# Patient Record
Sex: Female | Born: 1980 | Race: White | Hispanic: No | Marital: Single | Smoking: Never smoker
Health system: Southern US, Community
[De-identification: ages and names within clinical notes are randomized; demographics above are authoritative.]

## PROBLEM LIST (undated history)

## (undated) DIAGNOSIS — E079 Disorder of thyroid, unspecified: Secondary | ICD-10-CM

## (undated) DIAGNOSIS — F419 Anxiety disorder, unspecified: Secondary | ICD-10-CM

## (undated) HISTORY — PX: CHOLECYSTECTOMY: SHX55

## (undated) HISTORY — PX: TONSILLECTOMY: SUR1361

---

## 2005-12-03 ENCOUNTER — Emergency Department: Payer: Self-pay | Admitting: Emergency Medicine

## 2006-01-25 ENCOUNTER — Emergency Department: Payer: Self-pay | Admitting: Emergency Medicine

## 2006-01-26 ENCOUNTER — Inpatient Hospital Stay: Payer: Self-pay | Admitting: General Surgery

## 2006-01-26 ENCOUNTER — Ambulatory Visit: Payer: Self-pay | Admitting: Emergency Medicine

## 2011-11-04 ENCOUNTER — Emergency Department: Payer: Self-pay | Admitting: Emergency Medicine

## 2012-01-07 ENCOUNTER — Emergency Department: Payer: Self-pay | Admitting: Emergency Medicine

## 2013-07-18 ENCOUNTER — Emergency Department: Payer: Self-pay | Admitting: Emergency Medicine

## 2015-05-20 ENCOUNTER — Encounter: Payer: Self-pay | Admitting: Emergency Medicine

## 2015-05-20 ENCOUNTER — Emergency Department
Admission: EM | Admit: 2015-05-20 | Discharge: 2015-05-20 | Disposition: A | Discharge status: Home / Self Care | Payer: Medicare Other | Attending: Emergency Medicine | Admitting: Emergency Medicine

## 2015-05-20 DIAGNOSIS — K047 Periapical abscess without sinus: Secondary | ICD-10-CM

## 2015-05-20 DIAGNOSIS — K029 Dental caries, unspecified: Secondary | ICD-10-CM | POA: Insufficient documentation

## 2015-05-20 DIAGNOSIS — F419 Anxiety disorder, unspecified: Secondary | ICD-10-CM | POA: Insufficient documentation

## 2015-05-20 DIAGNOSIS — K0889 Other specified disorders of teeth and supporting structures: Secondary | ICD-10-CM | POA: Diagnosis present

## 2015-05-20 HISTORY — DX: Disorder of thyroid, unspecified: E07.9

## 2015-05-20 HISTORY — DX: Anxiety disorder, unspecified: F41.9

## 2015-05-20 MED ORDER — IBUPROFEN 800 MG PO TABS: 800.0000 mg | ORAL_TABLET | Freq: Three times a day (TID) | ORAL | Status: DC | PRN

## 2015-05-20 MED ORDER — HYDROCODONE-ACETAMINOPHEN 5-325 MG PO TABS: 1.0000 | ORAL_TABLET | ORAL | Status: DC | PRN

## 2015-05-20 MED ORDER — AMOXICILLIN 500 MG PO TABS: 500.0000 mg | ORAL_TABLET | Freq: Three times a day (TID) | ORAL | Status: DC

## 2015-05-20 NOTE — ED Provider Notes (Signed)
[facility] Emergency Department Provider Note  ____________________________________________  Time seen: Approximately 3:11 PM  I have reviewed the triage vital signs and the nursing notes.   HISTORY  Chief Complaint Dental Pain and Anxiety   HPI Savannah Leach is a 34 y.o. female 's for evaluation of dental pain. Patient states that the pains are normal for about 2 days progressively getting worse.   Past Medical History  Diagnosis Date  . Thyroid disease   . Anxiety     There are no active problems to display for this patient.   Past Surgical History  Procedure Laterality Date  . Tonsillectomy    . Cholecystectomy      Current Outpatient Rx  Name  Route  Sig  Dispense  Refill  . amoxicillin (AMOXIL) 500 MG tablet   Oral   Take 1 tablet (500 mg total) by mouth 3 (three) times daily.   30 tablet   0   . HYDROcodone-acetaminophen (NORCO) 5-325 MG tablet   Oral   Take 1-2 tablets by mouth every 4 (four) hours as needed for moderate pain.   8 tablet   0   . ibuprofen (ADVIL,MOTRIN) 800 MG tablet   Oral   Take 1 tablet (800 mg total) by mouth every 8 (eight) hours as needed.   30 tablet   0     Allergies Review of patient's allergies indicates no known allergies.  No family history on file.  Social History Social History  Substance Use Topics  . Smoking status: Never Smoker   . Smokeless tobacco: None  . Alcohol Use: No    Review of Systems Constitutional: No fever/chills Eyes: No visual changes. ENT: No sore throat. Positive dental pain Cardiovascular: Denies chest pain. Respiratory: Denies shortness of breath. Gastrointestinal: No abdominal pain.  No nausea, no vomiting.  No diarrhea.  No constipation. Genitourinary: Negative for dysuria. Musculoskeletal: Negative for back pain. Skin: Negative for rash. Neurological: Negative for headaches, focal weakness or numbness.  10-point ROS otherwise  negative.  ____________________________________________   PHYSICAL EXAM:  VITAL SIGNS: ED Triage Vitals  Enc Vitals Group     BP 05/20/15 1456 172/90 mmHg     Pulse Rate 05/20/15 1456 94     Resp 05/20/15 1456 18     Temp 05/20/15 1456 98.5 F (36.9 C)     Temp Source 05/20/15 1456 Oral     SpO2 05/20/15 1456 100 %     Weight 05/20/15 1456 180 lb (81.647 kg)     Height 05/20/15 1456  (1.651 m)     Head Cir --      Peak Flow --      Pain Score 05/20/15 1458 7     Pain Loc --      Pain Edu? --      Excl. in GC? --     Constitutional: Alert and oriented. Well appearing and in no acute distress. Does not appear to be anxious at this time. Eyes: Conjunctivae are normal. PERRL. EOMI. Head: Atraumatic. Nose: No congestion/rhinnorhea. Mouth/Throat: Mucous membranes are moist.  Oropharynx non-erythematous. Obvious dental caries left lower wisdom tooth. Neck: No stridor. No cervical tenderness no cervical adenopathy noted.  Cardiovascular: Normal rate, regular rhythm. Grossly normal heart sounds.  Good peripheral circulation. Respiratory: Normal respiratory effort.  No retractions. Lungs CTAB. Musculoskeletal: No lower extremity tenderness nor edema.  No joint effusions. Neurologic:  Normal speech and language. No gross focal neurologic deficits are appreciated. No gait  instability. Skin:  Skin is warm, dry and intact. No rash noted. Psychiatric: Mood and affect are normal. Speech and behavior are normal.  ____________________________________________   LABS (all labs ordered are listed, but only abnormal results are displayed)  Labs Reviewed - No data to display ____________________________________________    PROCEDURES  Procedure(s) performed: None  Critical Care performed: No  ____________________________________________   INITIAL IMPRESSION / ASSESSMENT AND PLAN / ED COURSE  Pertinent labs & imaging results that were available during my care of the patient  were reviewed by me and considered in my medical decision making (see chart for details).  Acute dental caries with questionable early developing abscess. Rx amoxicillin 500 mg 3 times a day Motrin 800 mg 3 times a day and hydrocodone 5/325 as needed for severe pain. Patient continue on current medications and follow up with PCP as needed. She voices no other emergency medical complaints at this time. ____________________________________________   FINAL CLINICAL IMPRESSION(S) / ED DIAGNOSES  Final diagnoses:  Infected dental carries      Evangeline Dakinharles M Beers, PA-C 05/20/15 1515  Phineas SemenGraydon Goodman, MD 05/21/15 [phone]

## 2015-05-20 NOTE — Discharge Instructions (Signed)
OPTIONS FOR DENTAL FOLLOW UP CARE ° °[place] [facility] of Health and Human Services - Local Safety Net Dental Clinics °http://www.ncdhhs.gov/dph/oralhealth/services/safetynetclinics.htm °  °[facility] (336-562-3123) ° °[facility] [place] ([phone]) ° °[facility] ([phone] ext [phone]) ° °[facility] ([phone]) ° °[facility] ([phone]) °This clinic caters to the indigent population and is on a lottery system. °Location: °[facility], [address], [place] °Clinic Hours: °Wednesdays from 6pm - 9pm, patients seen by a lottery system. °For dates, call or go to [email or web address].[facility].edu/[facility]/patients/Dental-[facility] °Services: °Cleanings, fillings and simple extractions. °Payment Options: °DENTAL WORK IS FREE OF CHARGE. Bring proof of income or support. °Best way to get seen: °Arrive at 5:15 pm - this is a lottery, NOT first come/first serve, so arriving earlier will not increase your chances of being seen. °  °  °[facility] °[phone] °Select option 1 for emergencies °  °Location: °[facility], [address], [place] °Clinic Hours: °No walk-ins accepted - call the day before to schedule an appointment. °Check in times are 9:30 am and 1:30 pm. °Services: °Simple extractions, temporary fillings, pulpectomy/pulp debridement, uncomplicated abscess drainage. °Payment Options: °PAYMENT IS DUE AT THE TIME OF SERVICE.  Fee is usually $100-200, additional surgical procedures (e.g. abscess drainage) may be extra. °Cash, checks, Visa/MasterCard accepted.  Can file Medicaid if patient is covered for dental - patient should call case worker to check. °No discount for [facility] patients. °Best way to get seen: °MUST call the day before and get onto the schedule. Can usually be seen the next 1-2 days. No walk-ins accepted. °  °  °[facility] °[phone] °   °Location: °[facility], [address], [place] °Clinic Hours: °M, W, Th, F 8am or 1:30pm, Tues 9a or 1:30 - first come/first served. °Services: °Simple extractions, temporary fillings, uncomplicated abscess drainage.  You do not need to be an [place] resident. °Payment Options: °PAYMENT IS DUE AT THE TIME OF SERVICE. °Dental insurance, otherwise sliding scale - bring proof of income or support. °Depending on income and treatment needed, cost is usually $50-200. °Best way to get seen: °Arrive early as it is first come/first served. °  °  °[facility] °[phone] °  °Location: °[address] °Clinic Hours: °Mon-Thu 8a-5p °Services: °Most basic dental services including extractions and fillings. °Payment Options: °PAYMENT IS DUE AT THE TIME OF SERVICE. °Sliding scale, up to 50% off - bring proof if income or support. °Medicaid with dental option accepted. °Best way to get seen: °Call to schedule an appointment, can usually be seen within 2 weeks OR they will try to see walk-ins - show up at 8a or 2p (you may have to wait). °  °  °[facility] °[phone] °[place] RESIDENTS ONLY °  °Location: °[facility], [address], [place],  [address] °Clinic Hours: By appointment only. °Monday - Thursday 8am-5pm, Friday 8am-12pm °Services: Cleanings, fillings, extractions. °Payment Options: °PAYMENT IS DUE AT THE TIME OF SERVICE. °Cash, Visa or MasterCard. Sliding scale - $30 minimum per service. °Best way to get seen: °Come in to office, complete packet and make an appointment - need proof of income °or support monies for each household member and proof of [place] residence. °Usually takes about a month to get in. °  °  °[facility] °[phone] °  °Location: °[address]   [place] °Clinic Hours: Walk-in Urgent Care Dental Services are offered Monday-Friday  mornings only. °The numbers of emergencies accepted daily is limited to the number of °providers available. °Maximum 15 - Mondays, Wednesdays & Thursdays °Maximum 10 - Tuesdays & Fridays °Services: °You do not need to be a [place] resident to be seen for a dental emergency. °Emergencies are defined as pain, swelling, abnormal bleeding, or dental trauma. Walkins will receive x-rays if needed. °NOTE: Dental cleaning is not an emergency. °Payment Options: °PAYMENT IS DUE AT THE TIME OF SERVICE. °Minimum co-pay is $40.00 for uninsured patients. °Minimum co-pay is $3.00 for Medicaid with dental coverage. °Dental Insurance is accepted and must be presented at time of visit. °Medicare does not cover dental. °Forms of payment: Cash, credit card, checks. °Best way to get seen: °If not previously registered with the clinic, walk-in dental registration begins at 7:15 am and is on a first come/first serve basis. °If previously registered with the clinic, call to make an appointment. °  °  °[facility] °[phone] °[place] RESIDENTS ONLY °  °Location: °[address], [place], [place] °Clinic Hours: °Mon-Thu 10a-2p °Services: Extractions only! °Payment Options: °FREE (donations accepted) - bring proof of income or support °Best way to get seen: °Call and schedule an appointment OR come at 8am on the 1st Monday of every month (except for holidays) when it is first come/first served. °  °  °[facility] °[phone] °  °Location: °[address], [place] °Clinic Hours: °Friday mornings °Services, Payment Options, Best way to get seen: °Call for info ° ° ° °Dental Caries °Dental caries is tooth decay. This decay can cause a hole in teeth (cavity) that can get bigger and deeper over time. °HOME CARE °· Brush and floss your teeth. Do this at least two times a day. °· Use a fluoride toothpaste. °· Use a mouth rinse if told by your dentist or doctor. °· Eat less sugary and starchy foods. Drink less sugary  drinks. °· Avoid snacking often on sugary and starchy foods. Avoid sipping often on sugary drinks. °· Keep regular checkups and cleanings with your dentist. °· Use fluoride supplements if told by your dentist or doctor. °· Allow fluoride to be applied to teeth if told by your dentist or doctor. °  °This information is not intended to replace advice given to you by your health care provider. Make sure you discuss any questions you have with your health care provider. °  °Document Released: 04/15/2008 Document Revised: 07/28/2014 Document Reviewed: 07/09/2012 °Elsevier Interactive Patient Education ©2016 [employer] Inc. ° °

## 2015-05-20 NOTE — ED Notes (Signed)
Pt presents to ED with complaints of oral pain on left side of mouth. Pt states she is also having a panic attack; pt with history of anxiety and has not taken morning meds. PA at bedside.

## 2015-06-04 ENCOUNTER — Emergency Department: Payer: Medicare Other

## 2015-06-04 ENCOUNTER — Encounter: Payer: Self-pay | Admitting: Emergency Medicine

## 2015-06-04 ENCOUNTER — Emergency Department
Admission: EM | Admit: 2015-06-04 | Discharge: 2015-06-04 | Disposition: A | Discharge status: Home / Self Care | Payer: Medicare Other | Attending: Emergency Medicine | Admitting: Emergency Medicine

## 2015-06-04 DIAGNOSIS — W010XXA Fall on same level from slipping, tripping and stumbling without subsequent striking against object, initial encounter: Secondary | ICD-10-CM | POA: Insufficient documentation

## 2015-06-04 DIAGNOSIS — S199XXA Unspecified injury of neck, initial encounter: Secondary | ICD-10-CM | POA: Diagnosis not present

## 2015-06-04 DIAGNOSIS — S59911A Unspecified injury of right forearm, initial encounter: Secondary | ICD-10-CM | POA: Diagnosis not present

## 2015-06-04 DIAGNOSIS — Y9289 Other specified places as the place of occurrence of the external cause: Secondary | ICD-10-CM | POA: Insufficient documentation

## 2015-06-04 DIAGNOSIS — Y998 Other external cause status: Secondary | ICD-10-CM | POA: Insufficient documentation

## 2015-06-04 DIAGNOSIS — Z79899 Other long term (current) drug therapy: Secondary | ICD-10-CM | POA: Diagnosis not present

## 2015-06-04 DIAGNOSIS — S4991XA Unspecified injury of right shoulder and upper arm, initial encounter: Secondary | ICD-10-CM | POA: Diagnosis present

## 2015-06-04 DIAGNOSIS — M7918 Myalgia, other site: Secondary | ICD-10-CM

## 2015-06-04 DIAGNOSIS — Y9389 Activity, other specified: Secondary | ICD-10-CM | POA: Insufficient documentation

## 2015-06-04 DIAGNOSIS — S6991XA Unspecified injury of right wrist, hand and finger(s), initial encounter: Secondary | ICD-10-CM | POA: Insufficient documentation

## 2015-06-04 DIAGNOSIS — Z792 Long term (current) use of antibiotics: Secondary | ICD-10-CM | POA: Insufficient documentation

## 2015-06-04 MED ORDER — CYCLOBENZAPRINE HCL 10 MG PO TABS: 10.0000 mg | ORAL_TABLET | Freq: Three times a day (TID) | ORAL | Status: DC | PRN

## 2015-06-04 MED ORDER — TRAMADOL HCL 50 MG PO TABS
50.0000 mg | ORAL_TABLET | Freq: Once | ORAL | Status: AC
  Administered 2015-06-04: 50 mg via ORAL

## 2015-06-04 MED ORDER — CYCLOBENZAPRINE HCL 10 MG PO TABS
ORAL_TABLET | ORAL | Status: AC
  Administered 2015-06-04: 10 mg via ORAL
  Filled 2015-06-04: qty 1

## 2015-06-04 MED ORDER — TRAMADOL HCL 50 MG PO TABS
ORAL_TABLET | ORAL | Status: AC
  Administered 2015-06-04: 50 mg via ORAL
  Filled 2015-06-04: qty 1

## 2015-06-04 MED ORDER — CYCLOBENZAPRINE HCL 10 MG PO TABS
10.0000 mg | ORAL_TABLET | Freq: Once | ORAL | Status: AC
  Administered 2015-06-04: 10 mg via ORAL

## 2015-06-04 MED ORDER — TRAMADOL HCL 50 MG PO TABS: 50.0000 mg | ORAL_TABLET | Freq: Four times a day (QID) | ORAL | Status: DC | PRN

## 2015-06-04 NOTE — ED Notes (Signed)
States pain from R side of neck down to hand

## 2015-06-04 NOTE — ED Provider Notes (Signed)
[facility] Emergency Department Provider Note ____________________________________________  Time seen: Approximately 3:50 PM  I have reviewed the triage vital signs and the nursing notes.   HISTORY  Chief Complaint Arm Injury   HPI Savannah Leach is a 34 y.o. female who presents to the emergency department for evaluation of a mechanical fall this morning while going to the mailbox. She states she tripped on a small stump and her body twisted as she fell and she landed directly on her right shoulder. She now has pain in the entire right arm that radiates into her neck.    Past Medical History  Diagnosis Date  . Thyroid disease   . Anxiety     There are no active problems to display for this patient.   Past Surgical History  Procedure Laterality Date  . Tonsillectomy    . Cholecystectomy      Current Outpatient Rx  Name  Route  Sig  Dispense  Refill  . levothyroxine (SYNTHROID, LEVOTHROID) 75 MCG tablet   Oral   Take 75 mcg by mouth daily before breakfast.         . PARoxetine (PAXIL) 20 MG tablet   Oral   Take 20 mg by mouth daily.         Marland Kitchen amoxicillin (AMOXIL) 500 MG tablet   Oral   Take 1 tablet (500 mg total) by mouth 3 (three) times daily.   30 tablet   0   . cyclobenzaprine (FLEXERIL) 10 MG tablet   Oral   Take 1 tablet (10 mg total) by mouth every 8 (eight) hours as needed for muscle spasms.   30 tablet   1   . HYDROcodone-acetaminophen (NORCO) 5-325 MG tablet   Oral   Take 1-2 tablets by mouth every 4 (four) hours as needed for moderate pain.   8 tablet   0   . ibuprofen (ADVIL,MOTRIN) 800 MG tablet   Oral   Take 1 tablet (800 mg total) by mouth every 8 (eight) hours as needed.   30 tablet   0   . traMADol (ULTRAM) 50 MG tablet   Oral   Take 1 tablet (50 mg total) by mouth every 6 (six) hours as needed.   9 tablet   0     Allergies Review of patient's allergies indicates no known allergies.  No family  history on file.  Social History Social History  Substance Use Topics  . Smoking status: Never Smoker   . Smokeless tobacco: None  . Alcohol Use: No    Review of Systems Constitutional: No recent illness. Eyes: No visual changes. ENT: No sore throat. Cardiovascular: Denies chest pain or palpitations. Respiratory: Denies shortness of breath. Gastrointestinal: No abdominal pain.  Genitourinary: Negative for dysuria. Musculoskeletal: Pain in right lateral neck; right shoulder, right forearm, and right wrist. Skin: Negative for rash. Neurological: Negative for headaches, focal weakness or numbness. 10-point ROS otherwise negative.  ____________________________________________   PHYSICAL EXAM:  VITAL SIGNS: ED Triage Vitals  Enc Vitals Group     BP 06/04/15 1527 164/81 mmHg     Pulse Rate 06/04/15 1527 92     Resp 06/04/15 1527 18     Temp 06/04/15 1527 98.4 F (36.9 C)     Temp Source 06/04/15 1527 Oral     SpO2 06/04/15 1527 99 %     Weight 06/04/15 1527 180 lb (81.647 kg)     Height 06/04/15 1527  (1.651 m)  Head Cir --      Peak Flow --      Pain Score 06/04/15 1529 7     Pain Loc --      Pain Edu? --      Excl. in GC? --    Constitutional: Alert and oriented. Well appearing and in no acute distress. Eyes: Conjunctivae are normal. EOMI. Head: Atraumatic. Nose: No congestion/rhinnorhea. Neck: No stridor.  Respiratory: Normal respiratory effort.   Musculoskeletal: Limited ROM of right wrist, pain worse with supination. Full ROM of shoulder with complaint of pain. Right lateral neck tender to palpation--Nexus Criteria negative. Neurologic:  Normal speech and language. No gross focal neurologic deficits are appreciated. Speech is normal. No gait instability. Skin:  Skin is warm, dry and intact. Atraumatic. Psychiatric: Mood and affect are normal. Speech and behavior are normal.  ____________________________________________   LABS (all labs ordered are  listed, but only abnormal results are displayed)  Labs Reviewed - No data to display ____________________________________________  RADIOLOGY  Right shoulder and forearm are negative for acute abnormality.  I, Kem Boroughsari Woodley Petzold, personally viewed and evaluated these images (plain radiographs) as part of my medical decision making.  ____________________________________________   PROCEDURES  Procedure(s) performed:   Sling applied to right arm for comfort by RN.   ____________________________________________   INITIAL IMPRESSION / ASSESSMENT AND PLAN / ED COURSE  Pertinent labs & imaging results that were available during my care of the patient were reviewed by me and considered in my medical decision making (see chart for details).  Patient was advised to follow up with primary care provider in 5 days if not improving or return to the ER for symptoms that change or worsen. ____________________________________________   FINAL CLINICAL IMPRESSION(S) / ED DIAGNOSES  Final diagnoses:  Musculoskeletal pain       Chinita PesterCari B Uzma Hellmer, FNP 06/04/15 2320  Jennye MoccasinBrian S Quigley, MD 06/04/15 2326

## 2015-06-04 NOTE — Discharge Instructions (Signed)

## 2015-06-11 ENCOUNTER — Emergency Department: Payer: Medicare Other

## 2015-06-11 ENCOUNTER — Encounter: Payer: Self-pay | Admitting: *Deleted

## 2015-06-11 ENCOUNTER — Emergency Department
Admission: EM | Admit: 2015-06-11 | Discharge: 2015-06-11 | Disposition: A | Discharge status: Home / Self Care | Payer: Medicare Other | Attending: Emergency Medicine | Admitting: Emergency Medicine

## 2015-06-11 DIAGNOSIS — Y9289 Other specified places as the place of occurrence of the external cause: Secondary | ICD-10-CM | POA: Insufficient documentation

## 2015-06-11 DIAGNOSIS — Y998 Other external cause status: Secondary | ICD-10-CM | POA: Diagnosis not present

## 2015-06-11 DIAGNOSIS — Z79899 Other long term (current) drug therapy: Secondary | ICD-10-CM | POA: Insufficient documentation

## 2015-06-11 DIAGNOSIS — S6991XA Unspecified injury of right wrist, hand and finger(s), initial encounter: Secondary | ICD-10-CM | POA: Diagnosis present

## 2015-06-11 DIAGNOSIS — W1839XA Other fall on same level, initial encounter: Secondary | ICD-10-CM | POA: Insufficient documentation

## 2015-06-11 DIAGNOSIS — S60211A Contusion of right wrist, initial encounter: Secondary | ICD-10-CM | POA: Insufficient documentation

## 2015-06-11 DIAGNOSIS — Y9389 Activity, other specified: Secondary | ICD-10-CM | POA: Diagnosis not present

## 2015-06-11 MED ORDER — NAPROXEN 500 MG PO TBEC: 500.0000 mg | DELAYED_RELEASE_TABLET | Freq: Two times a day (BID) | ORAL | Status: DC

## 2015-06-11 MED ORDER — HYDROCODONE-ACETAMINOPHEN 5-325 MG PO TABS: 1.0000 | ORAL_TABLET | ORAL | Status: DC | PRN

## 2015-06-11 NOTE — ED Provider Notes (Signed)
[facility] Emergency Department Provider Note  ____________________________________________  Time seen: Approximately 4:13 PM  I have reviewed the triage vital signs and the nursing notes.   HISTORY  Chief Complaint Wrist Pain    HPI FYNN ADEL is a 34 y.o. female presents for evaluation following last week and injured her right wrist. Patient states that she had initial x-rays but was told to come back if no better in a week. Patient still complained of pain.   Past Medical History  Diagnosis Date  . Thyroid disease   . Anxiety     There are no active problems to display for this patient.   Past Surgical History  Procedure Laterality Date  . Tonsillectomy    . Cholecystectomy      Current Outpatient Rx  Name  Route  Sig  Dispense  Refill  . cyclobenzaprine (FLEXERIL) 10 MG tablet   Oral   Take 1 tablet (10 mg total) by mouth every 8 (eight) hours as needed for muscle spasms.   30 tablet   1   . HYDROcodone-acetaminophen (NORCO) 5-325 MG tablet   Oral   Take 1-2 tablets by mouth every 4 (four) hours as needed for moderate pain.   10 tablet   0   . levothyroxine (SYNTHROID, LEVOTHROID) 75 MCG tablet   Oral   Take 75 mcg by mouth daily before breakfast.         . naproxen (EC NAPROSYN) 500 MG EC tablet   Oral   Take 1 tablet (500 mg total) by mouth 2 (two) times daily with a meal.   60 tablet   0   . PARoxetine (PAXIL) 20 MG tablet   Oral   Take 20 mg by mouth daily.           Allergies Review of patient's allergies indicates no known allergies.  History reviewed. No pertinent family history.  Social History Social History  Substance Use Topics  . Smoking status: Never Smoker   . Smokeless tobacco: None  . Alcohol Use: No    Review of Systems Constitutional: No fever/chills Eyes: No visual changes. ENT: No sore throat. Cardiovascular: Denies chest pain. Respiratory: Denies shortness of  breath. Gastrointestinal: No abdominal pain.  No nausea, no vomiting.  No diarrhea.  No constipation. Genitourinary: Negative for dysuria. Musculoskeletal: Positive for right wrist pain Skin: Negative for rash. Neurological: Negative for headaches, focal weakness or numbness.  10-point ROS otherwise negative.  ____________________________________________   PHYSICAL EXAM:  VITAL SIGNS: ED Triage Vitals  Enc Vitals Group     BP 06/11/15 1605 168/78 mmHg     Pulse Rate 06/11/15 1605 72     Resp 06/11/15 1605 20     Temp 06/11/15 1605 98 F (36.7 C)     Temp Source 06/11/15 1605 Oral     SpO2 06/11/15 1605 98 %     Weight 06/11/15 1559 180 lb (81.647 kg)     Height 06/11/15 1559  (1.651 m)     Head Cir --      Peak Flow --      Pain Score 06/11/15 1559 9     Pain Loc --      Pain Edu? --      Excl. in GC? --     Constitutional: Alert and oriented. Well appearing and in no acute distress. Eyes: Conjunctivae are normal. PERRL. EOMI. Head: Atraumatic. Nose: No congestion/rhinnorhea. Mouth/Throat: Mucous membranes are moist.  Oropharynx non-erythematous. Neck:  No stridor.   Cardiovascular: Normal rate, regular rhythm. Grossly normal heart sounds.  Good peripheral circulation. Respiratory: Normal respiratory effort.  No retractions. Lungs CTAB. Gastrointestinal: Soft and nontender. No distention. No abdominal bruits. No CVA tenderness. Musculoskeletal: No lower extremity tenderness nor edema.  No joint effusions. Neurologic:  Normal speech and language. No gross focal neurologic deficits are appreciated. No gait instability. Skin:  Skin is warm, dry and intact. No rash noted. Psychiatric: Mood and affect are normal. Speech and behavior are normal.  ____________________________________________   LABS (all labs ordered are listed, but only abnormal results are displayed)  Labs Reviewed - No data to display ____________________________________________ RADIOLOGY  No  acute osseous findings after 1 week follow-up. ____________________________________________   PROCEDURES  Procedure(s) performed: None  Critical Care performed: No  ____________________________________________   INITIAL IMPRESSION / ASSESSMENT AND PLAN / ED COURSE  Pertinent labs & imaging results that were available during my care of the patient were reviewed by me and considered in my medical decision making (see chart for details).  Left wrist sprain reassurance provided continue to wear the left wrist splint home or at the Ace wrap is provided today. Rx given for Anaprox 500 mg twice a day. Patient follow-up with PCP or return to the ER with any worsening symptomology. ____________________________________________   FINAL CLINICAL IMPRESSION(S) / ED DIAGNOSES  Final diagnoses:  Wrist contusion, right, initial encounter     Evangeline DakinCharles M Beers, PA-C 06/11/15 1713  Myrna Blazeravid Matthew Schaevitz, MD 06/11/15 [phone]

## 2015-06-11 NOTE — ED Notes (Signed)
Pt states she fell last week and was seen in the ER but states her right wrist still hurts and she can not pick up her cell phone

## 2015-11-20 ENCOUNTER — Encounter: Payer: Self-pay | Admitting: Emergency Medicine

## 2015-11-20 ENCOUNTER — Emergency Department
Admission: EM | Admit: 2015-11-20 | Discharge: 2015-11-20 | Disposition: A | Discharge status: Home / Self Care | Payer: Medicare Other | Attending: Emergency Medicine | Admitting: Emergency Medicine

## 2015-11-20 ENCOUNTER — Emergency Department: Payer: Medicare Other

## 2015-11-20 DIAGNOSIS — Z791 Long term (current) use of non-steroidal anti-inflammatories (NSAID): Secondary | ICD-10-CM | POA: Insufficient documentation

## 2015-11-20 DIAGNOSIS — J40 Bronchitis, not specified as acute or chronic: Secondary | ICD-10-CM | POA: Diagnosis not present

## 2015-11-20 DIAGNOSIS — Z79899 Other long term (current) drug therapy: Secondary | ICD-10-CM | POA: Diagnosis not present

## 2015-11-20 DIAGNOSIS — R05 Cough: Secondary | ICD-10-CM | POA: Diagnosis present

## 2015-11-20 LAB — BASIC METABOLIC PANEL
ANION GAP: 11 (ref 5–15)
BUN: 12 mg/dL (ref 6–20)
CO2: 20 mmol/L — AB (ref 22–32)
Calcium: 9.5 mg/dL (ref 8.9–10.3)
Chloride: 100 mmol/L — ABNORMAL LOW (ref 101–111)
Creatinine, Ser: 0.48 mg/dL (ref 0.44–1.00)
GFR calc non Af Amer: >60 mL/min (ref >60)
GLUCOSE: 372 mg/dL — AB (ref 65–99)
POTASSIUM: 3.2 mmol/L — AB (ref 3.5–5.1)
Sodium: 131 mmol/L — ABNORMAL LOW (ref 135–145)

## 2015-11-20 LAB — CBC
HEMATOCRIT: 40.1 % (ref 35.0–47.0)
HEMOGLOBIN: 13.6 g/dL (ref 12.0–16.0)
MCH: 27.3 pg (ref 26.0–34.0)
MCHC: 34.1 g/dL (ref 32.0–36.0)
MCV: 80 fL (ref 80.0–100.0)
Platelets: 425 10*3/uL (ref 150–440)
RBC: 5 MIL/uL (ref 3.80–5.20)
RDW: 14.3 % (ref 11.5–14.5)
WBC: 12.1 10*3/uL — ABNORMAL HIGH (ref 3.6–11.0)

## 2015-11-20 LAB — TROPONIN I
TROPONIN I: 0.04 ng/mL — AB (ref <0.031)
Troponin I: 0.04 ng/mL — ABNORMAL HIGH (ref <0.031)

## 2015-11-20 LAB — FIBRIN DERIVATIVES D-DIMER (ARMC ONLY): FIBRIN DERIVATIVES D-DIMER (ARMC): 239 (ref 0–499)

## 2015-11-20 MED ORDER — ALBUTEROL SULFATE HFA 108 (90 BASE) MCG/ACT IN AERS: 2.0000 | INHALATION_SPRAY | Freq: Four times a day (QID) | RESPIRATORY_TRACT | Status: AC | PRN

## 2015-11-20 MED ORDER — IPRATROPIUM-ALBUTEROL 0.5-2.5 (3) MG/3ML IN SOLN
3.0000 mL | Freq: Once | RESPIRATORY_TRACT | Status: AC
  Administered 2015-11-20: 3 mL via RESPIRATORY_TRACT
  Filled 2015-11-20: qty 3

## 2015-11-20 NOTE — ED Notes (Signed)
Lungs clear no wheezing.

## 2015-11-20 NOTE — ED Notes (Signed)
Pt here with c/o cough that began a few days ago, states she has felt a sharp pain in the scapula area as well. Pt having a panic attack, has a hx of the same. Denies fever.

## 2015-11-20 NOTE — ED Notes (Signed)
MD at bedside. 

## 2015-11-20 NOTE — ED Provider Notes (Signed)
[facility] Emergency Department Provider Note   ____________________________________________  Time seen: Approximately 5:44 PM  I have reviewed the triage vital signs and the nursing notes.   HISTORY  Chief Complaint Cough; Shortness of Breath; and Panic Attack    HPI Savannah Leach is a 35 y.o. female who reports she started with a nonproductive cough 3 days ago. She has some pain behind her shoulder blade is worse when she walks up stairs. She says she feels a little short of breath. She told the nurse she was congested she did not tell me that. She has no past medical problems except for anxiety and thyroid disease. She says she had a panic attack when I did the EKG. She denies running any fever she does say that sometimes she coughs so much she vomits.   Past Medical History  Diagnosis Date  . Thyroid disease   . Anxiety   . Anxiety     There are no active problems to display for this patient.   Past Surgical History  Procedure Laterality Date  . Tonsillectomy    . Cholecystectomy      Current Outpatient Rx  Name  Route  Sig  Dispense  Refill  . albuterol (PROVENTIL HFA;VENTOLIN HFA) 108 (90 Base) MCG/ACT inhaler   Inhalation   Inhale 2 puffs into the lungs every 6 (six) hours as needed (cough).   1 Inhaler   2     Dispense with spacer   . cyclobenzaprine (FLEXERIL) 10 MG tablet   Oral   Take 1 tablet (10 mg total) by mouth every 8 (eight) hours as needed for muscle spasms.   30 tablet   1   . HYDROcodone-acetaminophen (NORCO) 5-325 MG tablet   Oral   Take 1-2 tablets by mouth every 4 (four) hours as needed for moderate pain.   10 tablet   0   . levothyroxine (SYNTHROID, LEVOTHROID) 75 MCG tablet   Oral   Take 75 mcg by mouth daily before breakfast.         . naproxen (EC NAPROSYN) 500 MG EC tablet   Oral   Take 1 tablet (500 mg total) by mouth 2 (two) times daily with a meal.   60 tablet   0   . PARoxetine (PAXIL)  20 MG tablet   Oral   Take 20 mg by mouth daily.           Allergies Review of patient's allergies indicates no known allergies.  No family history on file.  Social History Social History  Substance Use Topics  . Smoking status: Never Smoker   . Smokeless tobacco: None  . Alcohol Use: No    Review of Systems Constitutional: No fever/chills Eyes: No visual changes. ENT: No sore throat. Cardiovascular: See history of present illness. Respiratory:See history of present illness Gastrointestinal: No abdominal pain.  No nausea, no vomiting.  No diarrhea.  No constipation. Genitourinary: Negative for dysuria. Musculoskeletal: See history of present illness Skin: Negative for rash. Neurological: Negative for headaches, focal weakness or numbness. } 10-point ROS otherwise negative.  ____________________________________________   PHYSICAL EXAM:  VITAL SIGNS: ED Triage Vitals  Enc Vitals Group     BP 11/20/15 1643 167/95 mmHg     Pulse Rate 11/20/15 1643 107     Resp 11/20/15 1643 18     Temp 11/20/15 1643 98.6 F (37 C)     Temp Source 11/20/15 1643 Oral     SpO2 11/20/15  1643 98 %     Weight 11/20/15 1643 170 lb (77.111 kg)     Height 11/20/15 1643 5\' 5"  (1.651 m)     Head Cir --      Peak Flow --      Pain Score --      Pain Loc --      Pain Edu? --      Excl. in GC? --     Constitutional: Alert and oriented. Well appearing and in no acute distress. Eyes: Conjunctivae are normal. PERRL. EOMI. Head: Atraumatic. Nose: No congestion/rhinnorhea. Mouth/Throat: Mucous membranes are moist.  Oropharynx non-erythematous. Neck: No stridor.   Cardiovascular: Tachycardic, regular rhythm. Grossly normal heart sounds.  Good peripheral circulation. Respiratory: Normal respiratory effort.  No retractions. Lungs CTAB. Gastrointestinal: Soft and nontender. No distention. No abdominal bruits. No CVA tenderness. Musculoskeletal: No lower extremity tenderness nor edema.  No  joint effusions. Neurologic:  Normal speech and language. No gross focal neurologic deficits are appreciated. No gait instability. Skin:  Skin is warm, dry and intact. No rash noted. Psychiatric: Mood and affect are normal. Speech and behavior are normal.  ____________________________________________   LABS (all labs ordered are listed, but only abnormal results are displayed)  Labs Reviewed  BASIC METABOLIC PANEL - Abnormal; Notable for the following:    Sodium 131 (*)    Potassium 3.2 (*)    Chloride 100 (*)    CO2 20 (*)    Glucose, Bld 372 (*)    All other components within normal limits  CBC - Abnormal; Notable for the following:    WBC 12.1 (*)    All other components within normal limits  TROPONIN I - Abnormal; Notable for the following:    Troponin I 0.04 (*)    All other components within normal limits  TROPONIN I - Abnormal; Notable for the following:    Troponin I 0.04 (*)    All other components within normal limits  FIBRIN DERIVATIVES D-DIMER [facility] ONLY)   ____________________________________________  EKG  EKG read and interpreted by me shows sinus tachycardia rate of 109 nonspecific ST-T wave changes diffusely. _______________________________________  RADIOLOGY  Chest x-ray read by radiology as no acute disease ____________________________________________   PROCEDURES    ____________________________________________   INITIAL IMPRESSION / ASSESSMENT AND PLAN / ED COURSE  Pertinent labs & imaging results that were available during my care of the patient were reviewed by me and considered in my medical decision making (see chart for details).  She reports she's much better after the nebulizer. I instructed her on use of the inhaler ____________________________________________   FINAL CLINICAL IMPRESSION(S) / ED DIAGNOSES  Final diagnoses:  Bronchitis      NEW MEDICATIONS STARTED DURING THIS VISIT:  New Prescriptions   ALBUTEROL (PROVENTIL  HFA;VENTOLIN HFA) 108 (90 BASE) MCG/ACT INHALER    Inhale 2 puffs into the lungs every 6 (six) hours as needed (cough).     Note:  This document was prepared using Dragon voice recognition software and may include unintentional dictation errors.    Arnaldo NatalPaul F Malinda, MD 11/20/15 2009

## 2015-11-20 NOTE — ED Notes (Signed)
Pt states she started having cough and congestion 3 days ago and states she thinks she has bronchitis.  She also reports being very nervous and has a hx of panic attacks.

## 2015-11-20 NOTE — ED Notes (Signed)
shob is panic related.

## 2015-11-20 NOTE — Discharge Instructions (Signed)
How to Use an Inhaler Proper inhaler technique is very important. Good technique ensures that the medicine reaches the lungs. Poor technique results in depositing the medicine on the tongue and back of the throat rather than in the airways. If you do not use the inhaler with good technique, the medicine will not help you. STEPS TO FOLLOW IF USING AN INHALER WITHOUT AN EXTENSION TUBE  Remove the cap from the inhaler.  If you are using the inhaler for the first time, you will need to prime it. Shake the inhaler for 5 seconds and release four puffs into the air, away from your face. Ask your health care provider or pharmacist if you have questions about priming your inhaler.  Shake the inhaler for 5 seconds before each breath in (inhalation).  Position the inhaler so that the top of the canister faces up.  Put your index finger on the top of the medicine canister. Your thumb supports the bottom of the inhaler.  Open your mouth.  Either place the inhaler between your teeth and place your lips tightly around the mouthpiece, or hold the inhaler 1-2 inches away from your open mouth. If you are unsure of which technique to use, ask your health care provider.  Breathe out (exhale) normally and as completely as possible.  Press the canister down with your index finger to release the medicine.  At the same time as the canister is pressed, inhale deeply and slowly until your lungs are completely filled. This should take 4-6 seconds. Keep your tongue down.  Hold the medicine in your lungs for 5-10 seconds (10 seconds is best). This helps the medicine get into the small airways of your lungs.  Breathe out slowly, through pursed lips. Whistling is an example of pursed lips.  Wait at least 15-30 seconds between puffs. Continue with the above steps until you have taken the number of puffs your health care provider has ordered. Do not use the inhaler more than your health care provider tells  you.  Replace the cap on the inhaler.  Follow the directions from your health care provider or the inhaler insert for cleaning the inhaler. STEPS TO FOLLOW IF USING AN INHALER WITH AN EXTENSION (SPACER)  Remove the cap from the inhaler.  If you are using the inhaler for the first time, you will need to prime it. Shake the inhaler for 5 seconds and release four puffs into the air, away from your face. Ask your health care provider or pharmacist if you have questions about priming your inhaler.  Shake the inhaler for 5 seconds before each breath in (inhalation).  Place the open end of the spacer onto the mouthpiece of the inhaler.  Position the inhaler so that the top of the canister faces up and the spacer mouthpiece faces you.  Put your index finger on the top of the medicine canister. Your thumb supports the bottom of the inhaler and the spacer.  Breathe out (exhale) normally and as completely as possible.  Immediately after exhaling, place the spacer between your teeth and into your mouth. Close your lips tightly around the spacer.  Press the canister down with your index finger to release the medicine.  At the same time as the canister is pressed, inhale deeply and slowly until your lungs are completely filled. This should take 4-6 seconds. Keep your tongue down and out of the way.  Hold the medicine in your lungs for 5-10 seconds (10 seconds is best). This helps the  medicine get into the small airways of your lungs. Exhale.  Repeat inhaling deeply through the spacer mouthpiece. Again hold that breath for up to 10 seconds (10 seconds is best). Exhale slowly. If it is difficult to take this second deep breath through the spacer, breathe normally several times through the spacer. Remove the spacer from your mouth.  Wait at least 15-30 seconds between puffs. Continue with the above steps until you have taken the number of puffs your health care provider has ordered. Do not use the  inhaler more than your health care provider tells you.  Remove the spacer from the inhaler, and place the cap on the inhaler.  Follow the directions from your health care provider or the inhaler insert for cleaning the inhaler and spacer. If you are using different kinds of inhalers, use your quick relief medicine to open the airways 10-15 minutes before using a steroid if instructed to do so by your health care provider. If you are unsure which inhalers to use and the order of using them, ask your health care provider, nurse, or respiratory therapist. If you are using a steroid inhaler, always rinse your mouth with water after your last puff, then gargle and spit out the water. Do not swallow the water. AVOID:  Inhaling before or after starting the spray of medicine. It takes practice to coordinate your breathing with triggering the spray.  Inhaling through the nose (rather than the mouth) when triggering the spray. HOW TO DETERMINE IF YOUR INHALER IS FULL OR NEARLY EMPTY You cannot know when an inhaler is empty by shaking it. A few inhalers are now being made with dose counters. Ask your health care provider for a prescription that has a dose counter if you feel you need that extra help. If your inhaler does not have a counter, ask your health care provider to help you determine the date you need to refill your inhaler. Write the refill date on a calendar or your inhaler canister. Refill your inhaler 7-10 days before it runs out. Be sure to keep an adequate supply of medicine. This includes making sure it is not expired, and that you have a spare inhaler.  SEEK MEDICAL CARE IF:   Your symptoms are only partially relieved with your inhaler.  You are having trouble using your inhaler.  You have some increase in phlegm. SEEK IMMEDIATE MEDICAL CARE IF:   You feel little or no relief with your inhalers. You are still wheezing and are feeling shortness of breath or tightness in your chest or  both.  You have dizziness, headaches, or a fast heart rate.  You have chills, fever, or night sweats.  You have a noticeable increase in phlegm production, or there is blood in the phlegm. MAKE SURE YOU:   Understand these instructions.  Will watch your condition.  Will get help right away if you are not doing well or get worse.   This information is not intended to replace advice given to you by your health care provider. Make sure you discuss any questions you have with your health care provider.   Document Released: 07/04/2000 Document Revised: 04/27/2013 Document Reviewed: 02/03/2013 Elsevier Interactive Patient Education 2016 [employer]  Upper Respiratory Infection, Adult Most upper respiratory infections (URIs) are a viral infection of the air passages leading to the lungs. A URI affects the nose, throat, and upper air passages. The most common type of URI is nasopharyngitis and is typically referred to as "the common cold."  URIs run their course and usually go away on their own. Most of the time, a URI does not require medical attention, but sometimes a bacterial infection in the upper airways can follow a viral infection. This is called a secondary infection. Sinus and middle ear infections are common types of secondary upper respiratory infections. Bacterial pneumonia can also complicate a URI. A URI can worsen asthma and chronic obstructive pulmonary disease (COPD). Sometimes, these complications can require emergency medical care and may be life threatening.  CAUSES Almost all URIs are caused by viruses. A virus is a type of germ and can spread from one person to another.  RISKS FACTORS You may be at risk for a URI if:   You smoke.   You have chronic heart or lung disease.  You have a weakened defense (immune) system.   You are very young or very old.   You have nasal allergies or asthma.  You work in crowded or poorly ventilated areas.  You work in health  care facilities or schools. SIGNS AND SYMPTOMS  Symptoms typically develop 2-3 days after you come in contact with a cold virus. Most viral URIs last 7-10 days. However, viral URIs from the influenza virus (flu virus) can last 14-18 days and are typically more severe. Symptoms may include:   Runny or stuffy (congested) nose.   Sneezing.   Cough.   Sore throat.   Headache.   Fatigue.   Fever.   Loss of appetite.   Pain in your forehead, behind your eyes, and over your cheekbones (sinus pain).  Muscle aches.  DIAGNOSIS  Your health care provider may diagnose a URI by:  Physical exam.  Tests to check that your symptoms are not due to another condition such as:  Strep throat.  Sinusitis.  Pneumonia.  Asthma. TREATMENT  A URI goes away on its own with time. It cannot be cured with medicines, but medicines may be prescribed or recommended to relieve symptoms. Medicines may help:  Reduce your fever.  Reduce your cough.  Relieve nasal congestion. HOME CARE INSTRUCTIONS   Take medicines only as directed by your health care provider.   Gargle warm saltwater or take cough drops to comfort your throat as directed by your health care provider.  Use a warm mist humidifier or inhale steam from a shower to increase air moisture. This may make it easier to breathe.  Drink enough fluid to keep your urine clear or pale yellow.   Eat soups and other clear broths and maintain good nutrition.   Rest as needed.   Return to work when your temperature has returned to normal or as your health care provider advises. You may need to stay home longer to avoid infecting others. You can also use a face mask and careful hand washing to prevent spread of the virus.  Increase the usage of your inhaler if you have asthma.   Do not use any tobacco products, including cigarettes, chewing tobacco, or electronic cigarettes. If you need help quitting, ask your health care  provider. PREVENTION  The best way to protect yourself from getting a cold is to practice good hygiene.   Avoid oral or hand contact with people with cold symptoms.   Wash your hands often if contact occurs.  There is no clear evidence that vitamin C, vitamin E, echinacea, or exercise reduces the chance of developing a cold. However, it is always recommended to get plenty of rest, exercise, and practice good nutrition.  SEEK MEDICAL CARE IF:   You are getting worse rather than better.   Your symptoms are not controlled by medicine.   You have chills.  You have worsening shortness of breath.  You have brown or red mucus.  You have yellow or brown nasal discharge.  You have pain in your face, especially when you bend forward.  You have a fever.  You have swollen neck glands.  You have pain while swallowing.  You have white areas in the back of your throat. SEEK IMMEDIATE MEDICAL CARE IF:   You have severe or persistent:  Headache.  Ear pain.  Sinus pain.  Chest pain.  You have chronic lung disease and any of the following:  Wheezing.  Prolonged cough.  Coughing up blood.  A change in your usual mucus.  You have a stiff neck.  You have changes in your:  Vision.  Hearing.  Thinking.  Mood. MAKE SURE YOU:   Understand these instructions.  Will watch your condition.  Will get help right away if you are not doing well or get worse.   This information is not intended to replace advice given to you by your health care provider. Make sure you discuss any questions you have with your health care provider.   Document Released: 12/31/2000 Document Revised: 11/21/2014 Document Reviewed: 10/12/2013 Elsevier Interactive Patient Education 2016 [employer]   Using inhaler 2 puffs 4 times a day as needed to help you with coughing and if you have any shortness of breath. Please return if worse also return for fever or feeling sicker.

## 2016-05-04 ENCOUNTER — Emergency Department
Admission: EM | Admit: 2016-05-04 | Discharge: 2016-05-04 | Disposition: A | Discharge status: Home / Self Care | Payer: Medicare Other | Attending: Emergency Medicine | Admitting: Emergency Medicine

## 2016-05-04 ENCOUNTER — Encounter: Payer: Self-pay | Admitting: Emergency Medicine

## 2016-05-04 DIAGNOSIS — J019 Acute sinusitis, unspecified: Secondary | ICD-10-CM | POA: Diagnosis not present

## 2016-05-04 DIAGNOSIS — J029 Acute pharyngitis, unspecified: Secondary | ICD-10-CM | POA: Diagnosis present

## 2016-05-04 MED ORDER — PROMETHAZINE-DM 6.25-15 MG/5ML PO SYRP: 5.0000 mL | ORAL_SOLUTION | Freq: Four times a day (QID) | ORAL | 0 refills | Status: AC | PRN

## 2016-05-04 MED ORDER — FLUTICASONE PROPIONATE 50 MCG/ACT NA SUSP: 2.0000 | Freq: Every day | NASAL | 0 refills | Status: AC

## 2016-05-04 MED ORDER — AMOXICILLIN 875 MG PO TABS: 875.0000 mg | ORAL_TABLET | Freq: Two times a day (BID) | ORAL | 0 refills | Status: AC

## 2016-05-04 MED ORDER — ACETAMINOPHEN 500 MG PO TABS
1000.0000 mg | ORAL_TABLET | Freq: Once | ORAL | Status: AC
  Administered 2016-05-04: 1000 mg via ORAL
  Filled 2016-05-04: qty 2

## 2016-05-04 NOTE — ED Provider Notes (Signed)
[facility] Emergency Department Provider Note  ____________________________________________  Time seen: Approximately 5:24 PM  I have reviewed the triage vital signs and the nursing notes.   HISTORY  Chief Complaint Sore Throat    HPI Savannah Leach is a 35 y.o. female , NAD, presents to the emergency department accompanied by her mother who assists with history. Patient states she has had nasal congestion, ear pressure and runny nose over the last 3-4 days. States sore throat began yesterday but was worse this morning. Hurts to swallow but she has been able to eat and drink. Denies any chest pain, cough, chest congestion, shortness of breath or wheezing. Has not had any sinus pressure or headaches. Denies any drainage from the ears. No known sick contacts. Denies fevers, chills, body aches. No abdominal pain, nausea or vomiting. Has not noted any rashes. Has been taking over-the-counter cough and cold medication without alleviation of symptoms. Patient's mother notes that the patient has "white coat syndrome" and blood pressure and pulse usually are elevated at arrival to medical appointments but usually declines by the end of the appointment.   Past Medical History:  Diagnosis Date  . Anxiety   . Anxiety   . Thyroid disease     There are no active problems to display for this patient.   Past Surgical History:  Procedure Laterality Date  . CHOLECYSTECTOMY    . TONSILLECTOMY      Prior to Admission medications   Medication Sig Start Date End Date Taking? Authorizing Provider  albuterol (PROVENTIL HFA;VENTOLIN HFA) 108 (90 Base) MCG/ACT inhaler Inhale 2 puffs into the lungs every 6 (six) hours as needed (cough). 11/20/15   Arnaldo Natal, MD  amoxicillin (AMOXIL) 875 MG tablet Take 1 tablet (875 mg total) by mouth 2 (two) times daily. 05/04/16   Rutha Melgoza L Wadie Mattie, PA-C  fluticasone (FLONASE) 50 MCG/ACT nasal spray Place 2 sprays into both nostrils daily.  05/04/16   Brookelynne Dimperio L Corey Laski, PA-C  levothyroxine (SYNTHROID, LEVOTHROID) 75 MCG tablet Take 75 mcg by mouth daily before breakfast.    Historical Provider, MD  PARoxetine (PAXIL) 20 MG tablet Take 20 mg by mouth daily.    Historical Provider, MD  promethazine-dextromethorphan (PROMETHAZINE-DM) 6.25-15 MG/5ML syrup Take 5 mLs by mouth 4 (four) times daily as needed for cough. 05/04/16   Shaman Muscarella L Landrey Mahurin, PA-C    Allergies Review of patient's allergies indicates no known allergies.  History reviewed. No pertinent family history.  Social History Social History  Substance Use Topics  . Smoking status: Never Smoker  . Smokeless tobacco: Not on file  . Alcohol use No     Review of Systems  Constitutional: No fever/chills, Decreased appetite Eyes: No visual changes. No discharge ENT: Positive nasal congestion, ear pain, runny nose, sore throat. No sinus pressure, ear drainage. Cardiovascular: No chest pain. Respiratory: No cough, chest congestion. No shortness of breath. No wheezing.  Gastrointestinal: No abdominal pain.  No nausea, vomiting.  Musculoskeletal: Negative for general myalgias.  Skin: Negative for rash. Neurological: Negative for headaches. 10-point ROS otherwise negative.  ____________________________________________   PHYSICAL EXAM:  VITAL SIGNS: ED Triage Vitals  Enc Vitals Group     BP 05/04/16 1701 (!) 181/100     Pulse Rate 05/04/16 1701 (!) 105     Resp 05/04/16 1701 18     Temp 05/04/16 1701 99.7 F (37.6 C)     Temp Source 05/04/16 1701 Oral     SpO2 05/04/16 1701 96 %  Weight 05/04/16 1700 160 lb (72.6 kg)     Height 05/04/16 1700 5\' 5"  (1.651 m)     Head Circumference --      Peak Flow --      Pain Score 05/04/16 1701 6     Pain Loc --      Pain Edu? --      Excl. in GC? --      Constitutional: Alert and oriented. Well appearing and in no acute distress. Eyes: Conjunctivae are normal.  Head: Atraumatic. ENT:      Ears: Bilateral TMs  visualized with significant serous effusion but no erythema, bulging, perforation.      Nose: Moderate congestion with moderate clear rhinorrhea.      Mouth/Throat: Mucous membranes are moist. Pharynx with mild injection but no swelling or exudate. Uvula is midline. 6, white postnasal drip. Neck: No stridor. Supple with full range of motion. Hematological/Lymphatic/Immunilogical: No cervical lymphadenopathy. Cardiovascular: Normal rate, regular rhythm. Normal S1 and S2.  Good peripheral circulation. Respiratory: Normal respiratory effort without tachypnea or retractions. Lungs CTAB with breath sounds noted in all lung fields. No wheeze, rhonchi, rales. Neurologic:  Normal speech and language. No gross focal neurologic deficits are appreciated.  Skin:  Skin is warm, dry and intact. No rash noted. Psychiatric: Mood and affect are normal. Speech and behavior are normal. Patient exhibits appropriate insight and judgement.   ____________________________________________   LABS (all labs ordered are listed, but only abnormal results are displayed)  Labs Reviewed - No data to display   Rapid strep test was negative. ____________________________________________  EKG  None ____________________________________________  RADIOLOGY  None ____________________________________________    PROCEDURES  Procedure(s) performed: None   Procedures   Medications  acetaminophen (TYLENOL) tablet 1,000 mg (1,000 mg Oral Given 05/04/16 1752)     ____________________________________________   INITIAL IMPRESSION / ASSESSMENT AND PLAN / ED COURSE  Pertinent labs & imaging results that were available during my care of the patient were reviewed by me and considered in my medical decision making (see chart for details).  Clinical Course  Comment By Time  Blood pressure and pulse rate at time of discharge were significantly decreased as compared to vital signs at time of arrival. Patient and her  mother at the bedside were advised to follow-up with her primary care provider if not improving over the next few days and to continue general follow-up and monitoring of blood pressure. Hope Pigeon, PA-C 10/15 1815    Patient's diagnosis is consistent with acute sinusitis. Patient will be discharged home with prescriptions for amoxicillin, Flonase and promethazine DM cough syrup to take as directed. May continue over-the-counter Tylenol or ibuprofen as needed for fever or aches. Patient is to follow up with her primary care provider or [facility] if symptoms persist past this treatment course. Patient is given ED precautions to return to the ED for any worsening or new symptoms.    ____________________________________________  FINAL CLINICAL IMPRESSION(S) / ED DIAGNOSES  Final diagnoses:  Acute sinusitis, recurrence not specified, unspecified location      NEW MEDICATIONS STARTED DURING THIS VISIT:  New Prescriptions   AMOXICILLIN (AMOXIL) 875 MG TABLET    Take 1 tablet (875 mg total) by mouth 2 (two) times daily.   FLUTICASONE (FLONASE) 50 MCG/ACT NASAL SPRAY    Place 2 sprays into both nostrils daily.   PROMETHAZINE-DEXTROMETHORPHAN (PROMETHAZINE-DM) 6.25-15 MG/5ML SYRUP    Take 5 mLs by mouth 4 (four) times daily as needed for cough.  Hope PigeonJami L Arpi Diebold, PA-C 05/04/16 1819    Minna AntisKevin Paduchowski, MD 05/04/16 2129

## 2016-05-04 NOTE — ED Triage Notes (Signed)
Started with nasal congestion yesterday and sore throat today. Hurts to swallow. No known fevers.

## 2016-05-04 NOTE — ED Notes (Signed)
States she has had congestion  Cough for couple of days   Sore throat this am  Low grade fever on arrival

## 2016-11-21 ENCOUNTER — Emergency Department
Admission: EM | Admit: 2016-11-21 | Discharge: 2016-11-21 | Disposition: A | Discharge status: Home / Self Care | Payer: Medicare Other | Attending: Emergency Medicine | Admitting: Emergency Medicine

## 2016-11-21 ENCOUNTER — Emergency Department: Payer: Medicare Other

## 2016-11-21 ENCOUNTER — Encounter: Payer: Self-pay | Admitting: Emergency Medicine

## 2016-11-21 DIAGNOSIS — Z79899 Other long term (current) drug therapy: Secondary | ICD-10-CM | POA: Diagnosis not present

## 2016-11-21 DIAGNOSIS — J209 Acute bronchitis, unspecified: Secondary | ICD-10-CM | POA: Insufficient documentation

## 2016-11-21 DIAGNOSIS — R05 Cough: Secondary | ICD-10-CM | POA: Diagnosis present

## 2016-11-21 MED ORDER — FLUTICASONE PROPIONATE 50 MCG/ACT NA SUSP: 2.0000 | Freq: Every day | NASAL | 0 refills | Status: AC

## 2016-11-21 MED ORDER — PANTOPRAZOLE SODIUM 20 MG PO TBEC: 20.0000 mg | DELAYED_RELEASE_TABLET | Freq: Every day | ORAL | 1 refills | Status: AC

## 2016-11-21 MED ORDER — BENZONATATE 100 MG PO CAPS
ORAL_CAPSULE | ORAL | 0 refills | Status: AC
Start: 2016-11-21 — End: ?

## 2016-11-21 MED ORDER — PREDNISONE 10 MG PO TABS: 10.0000 mg | ORAL_TABLET | Freq: Two times a day (BID) | ORAL | 0 refills | Status: AC

## 2016-11-21 MED ORDER — IPRATROPIUM-ALBUTEROL 0.5-2.5 (3) MG/3ML IN SOLN
3.0000 mL | Freq: Once | RESPIRATORY_TRACT | Status: AC
  Administered 2016-11-21: 3 mL via RESPIRATORY_TRACT
  Filled 2016-11-21: qty 3

## 2016-11-21 MED ORDER — AZITHROMYCIN 250 MG PO TABS: ORAL_TABLET | ORAL | 0 refills | Status: AC

## 2016-11-21 NOTE — ED Provider Notes (Signed)
[facility] Emergency Department Provider Note ____________________________________________  Time seen: 1433  I have reviewed the triage vital signs and the nursing notes.  HISTORY  Chief Complaint  Cough  HPI Savannah Leach is a 36 y.o. female presents to the ED with a one-week complaint of cough and nasal congestion. Patient describes that she coughed point of vomiting often. She reports the productive cough is worse overnight. She describes a Tmax on Tuesday of 101F. She has been dosing over-the-counter Robitussin cough syrup, and Coricidin HBP for cough relief. She denies any significant chest pain, shortness of breath, or wheezing.  Past Medical History:  Diagnosis Date  . Anxiety   . Anxiety   . Thyroid disease     There are no active problems to display for this patient.   Past Surgical History:  Procedure Laterality Date  . CHOLECYSTECTOMY    . TONSILLECTOMY      Prior to Admission medications   Medication Sig Start Date End Date Taking? Authorizing Provider  albuterol (PROVENTIL HFA;VENTOLIN HFA) 108 (90 Base) MCG/ACT inhaler Inhale 2 puffs into the lungs every 6 (six) hours as needed (cough). 11/20/15   Arnaldo Natal, MD  amoxicillin (AMOXIL) 875 MG tablet Take 1 tablet (875 mg total) by mouth 2 (two) times daily. 05/04/16   Jami L Hagler, PA-C  azithromycin (ZITHROMAX Z-PAK) 250 MG tablet Take 2 tablets (500 mg) on  Day 1,  followed by 1 tablet (250 mg) once daily on Days 2 through 5. 11/21/16   Keygan Dumond V Bacon Ronette Hank, PA-C  benzonatate (TESSALON PERLES) 100 MG capsule Take 1-2 tabs TID prn cough 11/21/16   Larrisa Cravey V Bacon Victormanuel Mclure, PA-C  fluticasone (FLONASE) 50 MCG/ACT nasal spray Place 2 sprays into both nostrils daily. 05/04/16   Jami L Hagler, PA-C  fluticasone (FLONASE) 50 MCG/ACT nasal spray Place 2 sprays into both nostrils daily. 11/21/16   Peggye Poon V Bacon Avacyn Kloosterman, PA-C  levothyroxine (SYNTHROID, LEVOTHROID) 75 MCG tablet Take 75 mcg by mouth  daily before breakfast.    Historical Provider, MD  pantoprazole (PROTONIX) 20 MG tablet Take 1 tablet (20 mg total) by mouth daily. 11/21/16 05/20/17  Niklas Chretien V Bacon Oryn Casanova, PA-C  PARoxetine (PAXIL) 20 MG tablet Take 20 mg by mouth daily.    Historical Provider, MD  promethazine-dextromethorphan (PROMETHAZINE-DM) 6.25-15 MG/5ML syrup Take 5 mLs by mouth 4 (four) times daily as needed for cough. 05/04/16   Jami L Hagler, PA-C    Allergies Patient has no known allergies.  No family history on file.  Social History Social History  Substance Use Topics  . Smoking status: Never Smoker  . Smokeless tobacco: Not on file  . Alcohol use No    Review of Systems  Constitutional: Positive for fever. Eyes: Negative for visual changes. ENT: Negative for sore throat. Cardiovascular: Negative for chest pain. Reports productive cough as above Respiratory: Negative for shortness of breath. Gastrointestinal: Negative for abdominal pain, vomiting and diarrhea. ____________________________________________  PHYSICAL EXAM:  VITAL SIGNS: ED Triage Vitals  Enc Vitals Group     BP 11/21/16 1353 (!) 168/91     Pulse Rate 11/21/16 1353 (!) 107     Resp 11/21/16 1353 18     Temp 11/21/16 1353 99.5 F (37.5 C)     Temp Source 11/21/16 1353 Oral     SpO2 11/21/16 1353 96 %     Weight 11/21/16 1354 170 lb (77.1 kg)     Height 11/21/16 1354 5\' 5"  (1.651 m)  Head Circumference --      Peak Flow --      Pain Score --      Pain Loc --      Pain Edu? --      Excl. in GC? --     Constitutional: Alert and oriented. Well appearing and in no distress. Head: Normocephalic and atraumatic. Eyes: Conjunctivae are normal. PERRL. Normal extraocular movements Ears: Canals clear. TMs intact bilaterally. Nose: No congestion/rhinorrhea/epistaxis. Mouth/Throat: Mucous membranes are moist. Uvula is midline and tonsils are flat. Neck: Supple. No thyromegaly. Hematological/Lymphatic/Immunological: No cervical  lymphadenopathy. Cardiovascular: Normal rate, regular rhythm. Normal distal pulses. Respiratory: Normal respiratory effort. No wheezes/rales. Mild rhonchi noted bilaterally. Intermittent cough during the exam.  Gastrointestinal: Soft and nontender. No distention. ____________________________________________   RADIOLOGY  CXR  IMPRESSION: Negative chest. ____________________________________________  PROCEDURES  DuoNeb x 1 ____________________________________________  INITIAL IMPRESSION / ASSESSMENT AND PLAN / ED COURSE  Patient with symptoms consistent with likely bronchitis versus an atypical pneumonia. Given her febrile condition and productive cough coupled with her duration of longer than a week and a half, the patient will be discharged with a prescription for azithromycin. She is also going to be discharged [employer] Perles, Flonase, and a refill on her Protonix. She will follow with her primary care provider for ongoing symptoms. Return to the ED for acutely worsening symptoms as discussed. ____________________________________________  FINAL CLINICAL IMPRESSION(S) / ED DIAGNOSES  Final diagnoses:  Acute bronchitis, unspecified organism      Lissa HoardJenise V Bacon Sherryl Valido, PA-C 11/21/16 1613    Phineas SemenGoodman, Graydon, MD 11/22/16 1537

## 2016-11-21 NOTE — ED Triage Notes (Signed)
Pt reports cough and nasal congestion for over one week. Pt states she will cough so hard it makes her vomit. Pt reports cough is productive, coughing up yellow phlegm. Pt reports fever up to 101. Pt in no apparent distress in triage.

## 2016-11-21 NOTE — Discharge Instructions (Signed)
Your chest x-ray was negative for any acute infection. Your symptoms are consistent with a bronchitis. Take the prescription meds as directed. Follow-up with your provider for ongoing symptoms. Return to the ED as needed.

## 2016-11-21 NOTE — ED Notes (Signed)
Signature pad not working.  Patient verbalized understanding of discharge instructions and has no further questions. 

## 2018-09-05 IMAGING — CR DG CHEST 2V
2 series · 2 of 2 positions shown · non-contrast
Comparison: 11/20/2015

CLINICAL DATA: Productive cough and fever for 1 week

EXAM:
CHEST  2 VIEW

[chest lat]
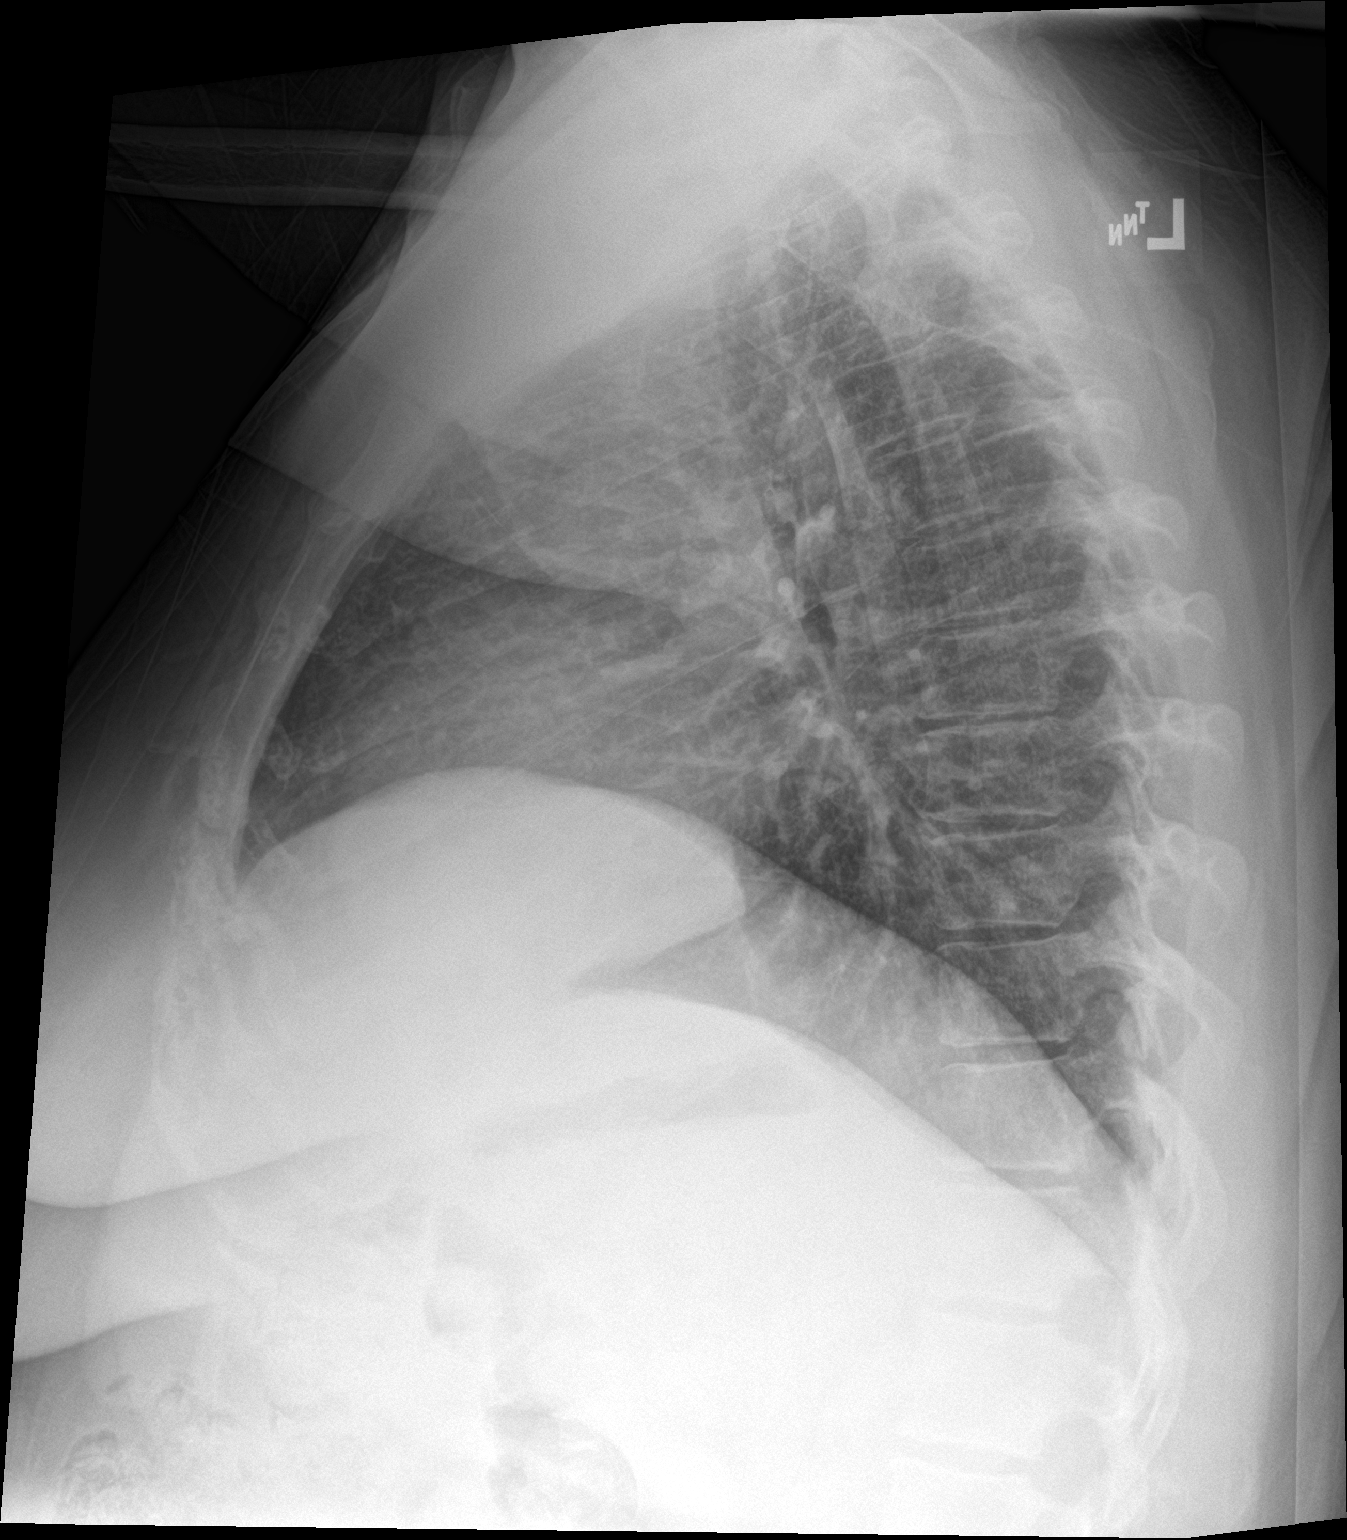

[chest ap]
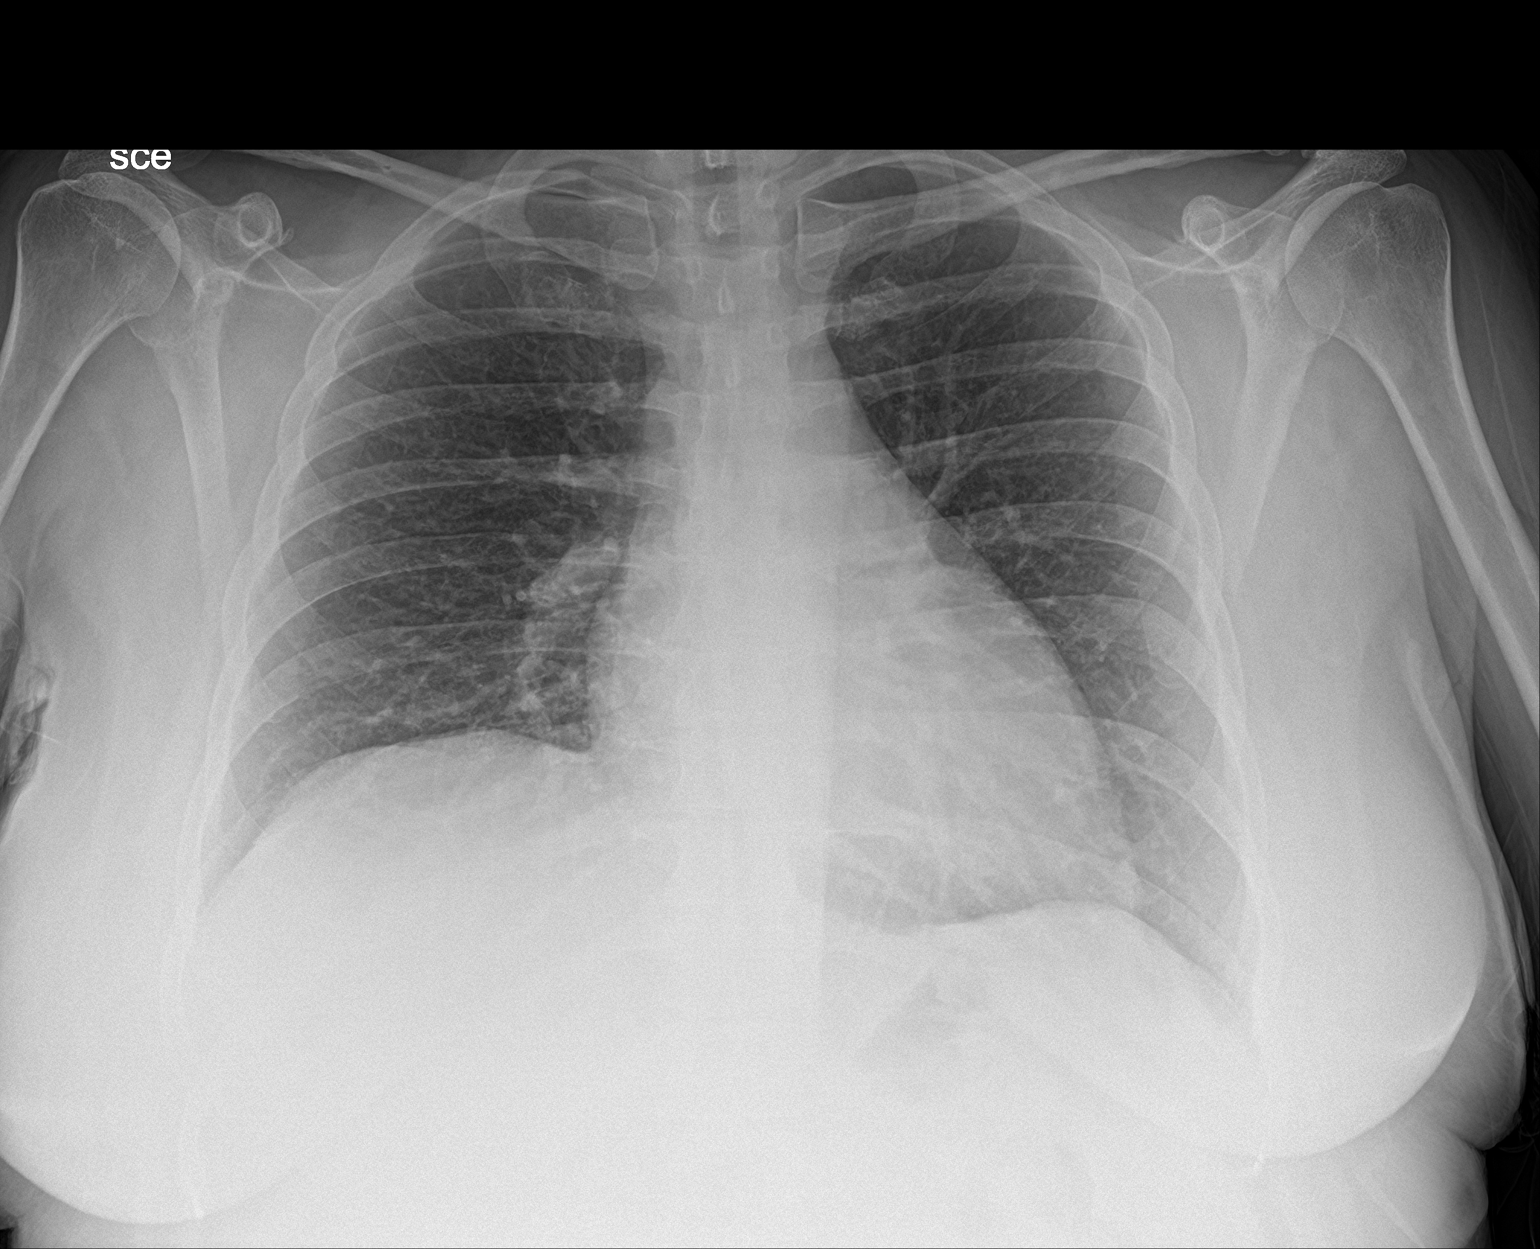

[2 of 2 positions shown; findings below may reference images not displayed]

FINDINGS: Normal heart size and mediastinal contours. No acute infiltrate or
edema. No effusion or pneumothorax. No acute osseous findings.
IMPRESSION: Negative chest.

## 2021-10-29 ENCOUNTER — Emergency Department: Payer: Medicare Other

## 2021-10-29 ENCOUNTER — Encounter: Admission: EM | Disposition: E | Payer: Self-pay | Source: Home / Self Care | Attending: Emergency Medicine

## 2021-10-29 ENCOUNTER — Emergency Department
Admission: EM | Admit: 2021-10-29 | Discharge: 2021-11-18 | Disposition: E | Payer: Medicare Other | Attending: Emergency Medicine | Admitting: Emergency Medicine

## 2021-10-29 ENCOUNTER — Other Ambulatory Visit: Payer: Self-pay

## 2021-10-29 DIAGNOSIS — R739 Hyperglycemia, unspecified: Secondary | ICD-10-CM | POA: Diagnosis not present

## 2021-10-29 DIAGNOSIS — I469 Cardiac arrest, cause unspecified: Secondary | ICD-10-CM

## 2021-10-29 DIAGNOSIS — E039 Hypothyroidism, unspecified: Secondary | ICD-10-CM | POA: Insufficient documentation

## 2021-10-29 DIAGNOSIS — D72829 Elevated white blood cell count, unspecified: Secondary | ICD-10-CM | POA: Insufficient documentation

## 2021-10-29 DIAGNOSIS — Z7901 Long term (current) use of anticoagulants: Secondary | ICD-10-CM | POA: Diagnosis not present

## 2021-10-29 LAB — CBC WITH DIFFERENTIAL/PLATELET
Abs Immature Granulocytes: 2.9 10*3/uL — ABNORMAL HIGH (ref 0.00–0.07)
Basophils Absolute: 0.3 10*3/uL — ABNORMAL HIGH (ref 0.0–0.1)
Basophils Relative: 1 %
Eosinophils Absolute: 0.3 10*3/uL (ref 0.0–0.5)
Eosinophils Relative: 1 %
HCT: 41.6 % (ref 36.0–46.0)
Hemoglobin: 12.3 g/dL (ref 12.0–15.0)
Immature Granulocytes: 10 %
Lymphocytes Relative: 34 %
Lymphs Abs: 9.4 10*3/uL — ABNORMAL HIGH (ref 0.7–4.0)
MCH: 26.3 pg (ref 26.0–34.0)
MCHC: 29.6 g/dL — ABNORMAL LOW (ref 30.0–36.0)
MCV: 88.9 fL (ref 80.0–100.0)
Monocytes Absolute: 0.7 10*3/uL (ref 0.1–1.0)
Monocytes Relative: 3 %
Neutro Abs: 14.5 10*3/uL — ABNORMAL HIGH (ref 1.7–7.7)
Neutrophils Relative %: 51 %
Platelets: 356 10*3/uL (ref 150–400)
RBC: 4.68 MIL/uL (ref 3.87–5.11)
RDW: 13.3 % (ref 11.5–15.5)
Smear Review: NORMAL
WBC Morphology: ABNORMAL
WBC: 28.2 10*3/uL — ABNORMAL HIGH (ref 4.0–10.5)
nRBC: 0.1 % (ref 0.0–0.2)

## 2021-10-29 LAB — URINALYSIS, ROUTINE W REFLEX MICROSCOPIC
Bacteria, UA: NONE SEEN
Bilirubin Urine: NEGATIVE
Glucose, UA: >=500 mg/dL — AB
Hgb urine dipstick: NEGATIVE
Ketones, ur: NEGATIVE mg/dL
Leukocytes,Ua: NEGATIVE
Nitrite: NEGATIVE
Protein, ur: NEGATIVE mg/dL
Specific Gravity, Urine: 1.021 (ref 1.005–1.030)
pH: 5 (ref 5.0–8.0)

## 2021-10-29 LAB — COMPREHENSIVE METABOLIC PANEL
ALT: 172 U/L — ABNORMAL HIGH (ref 0–44)
AST: 243 U/L — ABNORMAL HIGH (ref 15–41)
Albumin: 2.8 g/dL — ABNORMAL LOW (ref 3.5–5.0)
Alkaline Phosphatase: 113 U/L (ref 38–126)
Anion gap: 17 — ABNORMAL HIGH (ref 5–15)
BUN: 16 mg/dL (ref 6–20)
CO2: 15 mmol/L — ABNORMAL LOW (ref 22–32)
Calcium: 8.2 mg/dL — ABNORMAL LOW (ref 8.9–10.3)
Chloride: 99 mmol/L (ref 98–111)
Creatinine, Ser: 0.95 mg/dL (ref 0.44–1.00)
GFR, Estimated: >60 mL/min (ref >60)
Glucose, Bld: 669 mg/dL (ref 70–99)
Potassium: 3.3 mmol/L — ABNORMAL LOW (ref 3.5–5.1)
Sodium: 131 mmol/L — ABNORMAL LOW (ref 135–145)
Total Bilirubin: 0.4 mg/dL (ref 0.3–1.2)
Total Protein: 6.3 g/dL — ABNORMAL LOW (ref 6.5–8.1)

## 2021-10-29 LAB — TROPONIN I (HIGH SENSITIVITY): Troponin I (High Sensitivity): 1550 ng/L (ref <18)

## 2021-10-29 LAB — PROTIME-INR
INR: 1.6 — ABNORMAL HIGH (ref 0.8–1.2)
Prothrombin Time: 18.6 seconds — ABNORMAL HIGH (ref 11.4–15.2)

## 2021-10-29 LAB — CBG MONITORING, ED: Glucose-Capillary: 489 mg/dL — ABNORMAL HIGH (ref 70–99)

## 2021-10-29 LAB — LACTIC ACID, PLASMA: Lactic Acid, Venous: >9 mmol/L (ref 0.5–1.9)

## 2021-10-29 LAB — PATHOLOGIST SMEAR REVIEW

## 2021-10-29 SURGERY — CORONARY/GRAFT ACUTE MI REVASCULARIZATION
Anesthesia: Moderate Sedation

## 2021-10-29 MED ORDER — VECURONIUM BROMIDE 10 MG IV SOLR
INTRAVENOUS | Status: AC
  Filled 2021-10-29: qty 10

## 2021-10-29 MED ORDER — ETOMIDATE 2 MG/ML IV SOLN
INTRAVENOUS | Status: AC
  Filled 2021-10-29: qty 10

## 2021-10-29 MED ORDER — FENTANYL CITRATE PF 50 MCG/ML IJ SOSY
PREFILLED_SYRINGE | INTRAMUSCULAR | Status: AC
  Filled 2021-10-29: qty 1

## 2021-10-29 MED ORDER — ROCURONIUM BROMIDE 10 MG/ML (PF) SYRINGE
PREFILLED_SYRINGE | INTRAVENOUS | Status: AC
  Filled 2021-10-29: qty 10

## 2021-11-18 NOTE — ED Notes (Signed)
Pulse check, no pulse cpr restarted ?

## 2021-11-18 NOTE — ED Notes (Signed)
Pulse check, sinus brady possible pulse, cpr restarted ?

## 2021-11-18 NOTE — ED Notes (Signed)
1 Bicarb given at this time 

## 2021-11-18 NOTE — ED Notes (Signed)
Pulse check, pulse present

## 2021-11-18 NOTE — ED Notes (Signed)
Pulse check no pulse cpr restarted ? ?

## 2021-11-18 NOTE — ED Notes (Signed)
1 epi given 

## 2021-11-18 NOTE — ED Notes (Signed)
Family requesting cpr to stop and pt to transition to comfort measures at this time.  ?

## 2021-11-18 NOTE — ED Notes (Signed)
Pulse check, no pulse CPR restarted.

## 2021-11-18 NOTE — ED Notes (Signed)
MD Quale informed of pt's lactic result ?

## 2021-11-18 NOTE — ED Notes (Signed)
No pulses, cpr restarted ?

## 2021-11-18 NOTE — ED Notes (Signed)
Pulse check, no pulse, pt shocked ?

## 2021-11-18 NOTE — ED Notes (Signed)
No pulse present, cpr restarted ?

## 2021-11-18 NOTE — ED Notes (Signed)
Pulse check, pulse present, 1 epi given ?

## 2021-11-18 NOTE — ED Notes (Signed)
Time of death called 11:28 by MD Jacqualine Code, family at bedside with pt ?

## 2021-11-18 NOTE — ED Notes (Signed)
No pulse cpr continued 

## 2021-11-18 NOTE — ED Notes (Signed)
Cardiologist informed of glucose  669 and trop results 1550 ?

## 2021-11-18 NOTE — ED Notes (Signed)
1 bicarb given 

## 2021-11-18 NOTE — ED Notes (Signed)
Dopamine increased to 27mcg/per kg per minute ?

## 2021-11-18 NOTE — ED Notes (Signed)
Aspirin rectally placed at this time. ?

## 2021-11-18 NOTE — ED Notes (Signed)
50 mcg fent given at this time ?

## 2021-11-18 NOTE — ED Notes (Signed)
Pulse check, no pulse cpr restarted ?

## 2021-11-18 NOTE — ED Notes (Signed)
Pt being paced at this time.

## 2021-11-18 NOTE — ED Notes (Signed)
Pulse check, no pulse pea ?

## 2021-11-18 NOTE — ED Notes (Signed)
MD Quale getting family at this time. ?

## 2021-11-18 NOTE — ED Notes (Signed)
Family at bedside. 

## 2021-11-18 NOTE — ED Triage Notes (Signed)
Pt comes via EMS with LUCAS in place. Pulse present, IO in right leg. ? ?MD Quale at bedside, RT at bedside. ? ?Pt being placed on monitor, pads placed, pt being bagged ? ?Pt c/o CP radiating to shoulder. Pt was witnessed arrest. 5 epi given, shocked 3 time ? ?1000 fluids ? CBG-481 ?BP-107/60 ?

## 2021-11-18 NOTE — ED Notes (Signed)
Pulse check, no pulse at this time. Cpr restarted ?

## 2021-11-18 NOTE — ED Notes (Signed)
300mg  amio going in now ?

## 2021-11-18 NOTE — ED Notes (Signed)
Pulse check pea agonal, cpr restarted ?

## 2021-11-18 NOTE — ED Notes (Signed)
Pulse check, pt brady pulse present, lost pulse cpr restarted ?

## 2021-11-18 NOTE — ED Notes (Signed)
Pulse check slow pulse present at this time. ?

## 2021-11-18 NOTE — ED Notes (Signed)
Fresh pads placed on pt at this time. ?

## 2021-11-18 NOTE — ED Notes (Signed)
No pulse.  CPR started

## 2021-11-18 NOTE — ED Notes (Signed)
Code  stemi  called  to  Lauren  at  [facility] ?

## 2021-11-18 NOTE — ED Notes (Signed)
Pt's eyes opening per MD Quale, pt is perfusing ?

## 2021-11-18 NOTE — ED Notes (Signed)
Pulse check,  slow pea, no pulse cpr restarted ?

## 2021-11-18 NOTE — ED Notes (Signed)
Looking like stemi cardiologist paged at this time, repeat EKG ?

## 2021-11-18 NOTE — ED Notes (Signed)
Doppler being used to assess for pulse per MD Quale ?

## 2021-11-18 NOTE — ED Notes (Signed)
Airway placed good color

## 2021-11-18 NOTE — ED Notes (Signed)
Pulse check, no pulse but pt taking good respirations per MD Quale, CPR restarted, no cardiac activity per [place] ?

## 2021-11-18 NOTE — Progress Notes (Signed)
?   11-04-2021 1020  ?Clinical Encounter Type  ?Visited With Health care provider;Other (Comment) ?(ED desk)  ?Visit Type Critical Care;Code  ? ?Chaplain Burris present on unit in response to code STEMI w CPR in progress. Chaplain checked-in with desk and then with RN as able. Emergent care still in progress. ? ?Savannah Leach assessed whether family was present and remained in communication with staff; received word that Pt's mother is awaiting her ride and will be arriving. ? ?Savannah Leach notified care team of this and plan to escort mother to family consult room upon her arrival for physician debrief. ?

## 2021-11-18 NOTE — ED Notes (Signed)
150 of amio given at this time ?

## 2021-11-18 NOTE — ED Notes (Signed)
2 L Fluids started at this time. ?

## 2021-11-18 NOTE — Consult Note (Signed)
?Cardiology Consultation:  ? ?Patient ID: Savannah Leach ?MRN: [id number]; DOB: Jun 12, 1981 ? ?Admit date: Nov 25, 2021 ?Date of Consult: 11-25-21 ? ?PCP:  Charlynne Cousins, MD ?  ?[facility] ?Cardiologist:  New - Lewi Drost   ? ? ?Patient Profile:  ? ?Savannah Leach is a 41 y.o. female with a hx of thyroid disease and anxiety, who is being seen 11/25/21 for the evaluation of STEMI and cardiac arrest at the request of Dr. Jacqualine Code.  History is obtained from EMS and the patient's mother, as the patient was unresponsive throughout my encounter with her. ? ?History of Present Illness:  ? ?Savannah Leach reportedly developed chest pain radiating to the left arm and back this morning.  Her mother gave her aspirin and applied Vicks VapoRub to her chest.  However, after a few minutes, the chest pain worsened and Savannah Leach became unresponsive.  Mother summoned EMS, who found Savannah Leach unresponsive but with a pulse.  However, after they connected cardiac monitoring, a pulse was no longer palpable, consistent with PEA.  She required prolonged resuscitation of almost 2 hours before she arrived at [facility].  During this time, she had multiple episodes of PEA and VT/VF, receiving a total of 3 shocks from EMS.  EKG following ROSC showed widespread ST segment elevation.  She was intubated in the emergency department but could not maintain a perfusing rhythm for more than 1 or 2 minutes.  She had recurrent VT/VF requiring defibrillation and IV amiodarone.  However, she ultimately developed an agonal rhythm and PEA.  Pulse was present on a few occasions, prompting initiation of dopamine and transcutaneous pacing, however, recurrent cardiac arrest ensued within a minute or two. ? ? ?Past Medical History:  ?Diagnosis Date  ? Anxiety   ? Anxiety   ? Thyroid disease   ? ? ?Past Surgical History:  ?Procedure Laterality Date  ? CHOLECYSTECTOMY    ? TONSILLECTOMY    ?  ? ?Home Medications:  ?Prior to Admission medications   ?Medication Sig Start Date  Salena Ortlieb Date Taking? Authorizing Provider  ?albuterol (PROVENTIL HFA;VENTOLIN HFA) 108 (90 Base) MCG/ACT inhaler Inhale 2 puffs into the lungs every 6 (six) hours as needed (cough). 11/20/15   Nena Polio, MD  ?amoxicillin (AMOXIL) 875 MG tablet Take 1 tablet (875 mg total) by mouth 2 (two) times daily. 05/04/16   Hagler, Jami L, PA-C  ?azithromycin (ZITHROMAX Z-PAK) 250 MG tablet Take 2 tablets (500 mg) on  Day 1,  followed by 1 tablet (250 mg) once daily on Days 2 through 5. 11/21/16   Menshew, Dannielle Karvonen, PA-C  ?benzonatate (TESSALON PERLES) 100 MG capsule Take 1-2 tabs TID prn cough 11/21/16   Menshew, Dannielle Karvonen, PA-C  ?fluticasone (FLONASE) 50 MCG/ACT nasal spray Place 2 sprays into both nostrils daily. 05/04/16   Hagler, Jami L, PA-C  ?fluticasone (FLONASE) 50 MCG/ACT nasal spray Place 2 sprays into both nostrils daily. 11/21/16   Menshew, Dannielle Karvonen, PA-C  ?levothyroxine (SYNTHROID, LEVOTHROID) 75 MCG tablet Take 75 mcg by mouth daily before breakfast.    [provider]  ?pantoprazole (PROTONIX) 20 MG tablet Take 1 tablet (20 mg total) by mouth daily. 11/21/16 05/20/17  Menshew, Dannielle Karvonen, PA-C  ?PARoxetine (PAXIL) 20 MG tablet Take 20 mg by mouth daily.    [provider]  ?predniSONE (DELTASONE) 10 MG tablet Take 1 tablet (10 mg total) by mouth 2 (two) times daily with a meal. 11/21/16   Menshew, Dannielle Karvonen,  PA-C  ?promethazine-dextromethorphan (PROMETHAZINE-DM) 6.25-15 MG/5ML syrup Take 5 mLs by mouth 4 (four) times daily as needed for cough. 05/04/16   Hagler, Jami L, PA-C  ? ? ?Inpatient Medications: ?Scheduled Meds: ? etomidate      ? fentaNYL      ? vecuronium      ? ?Continuous Infusions: ? ?PRN Meds: ? ? ?Allergies:   No Known Allergies ? ?Social History:   ?Unable to obtain, as the patient is intubated and unresponsive. ?  ?Family History:   ?Unable to obtain, as the patient is intubated and unresponsive. ? ?ROS:  ?Unable to obtain as the patient is intubated and  unresponsive. ? ?Physical Exam/Data:  ? ?Vitals:  ? 31-Oct-2021 1127 Oct 31, 2021 1128 10-31-21 1129 10-31-21 1131  ?BP:      ?Pulse:      ?Resp:      ?Temp: 98.2 ?F (36.8 ?C) 98.2 ?F (36.8 ?C) 98.2 ?F (36.8 ?C)   ?TempSrc:      ?SpO2:      ?Weight:    77.1 kg  ?Height:    5\' 5"  (1.651 m)  ? ?No intake or output data in the 24 hours ending 10-31-2021 1803 ? ?  October 31, 2021  ? 11:31 AM 11/21/2016  ?  1:54 PM 05/04/2016  ?  5:00 PM  ?Last 3 Weights  ?Weight (lbs) 169 lb 15.6 oz 170 lb 160 lb  ?Weight (kg) 77.1 kg 77.111 kg 72.576 kg  ?   ?Body mass index is 28.29 kg/m?.  ?General: Ill-appearing woman, lying on ED stretcher. ?HEENT: Endotracheal tube in place. ?Neck: Unable to assess JVP due to positioning and support devices. ?Vascular: 1+ bilateral femoral pulses palpable during brief episodes of ROSC ?Cardiac: Distant heart sounds. ?Lungs: Coarse breath sounds anteriorly. ?Abd: soft, nontender, no hepatomegaly  ?Ext: no edema ?Musculoskeletal: No deformities. ?Skin: Cool and mottled extremities. ?Neuro: Intubated and unresponsive, though she would occasionally briefly open her eyes during CPR. ?Psych: Intubated and unresponsive. ? ?EKG:  The EKG was personally reviewed and demonstrates: Sinus tachycardia with widespread ST elevation. ?Telemetry:  Telemetry was personally reviewed and demonstrates: Sinus rhythm and episodes of ventricular tachycardia/ventricular fibrillation.  Ultimately, patient had agonal rhythm and sinus bradycardia. ? ?Relevant CV Studies: ?Limited bedside echo personally performed during brief periods of ROSC showed minimal cardiac motion.  LVEF less than 10%.  No pericardial effusion identified.  Severe RV hypokinesis also present. ? ?Laboratory Data: ? ?High Sensitivity Troponin:   ?Recent Labs  ?Lab October 31, 2021 ?1013  ?TROPONINIHS 1,550*  ?   ?Chemistry ?Recent Labs  ?Lab 10-31-21 ?1013  ?NA 131*  ?K 3.3*  ?CL 99  ?CO2 15*  ?GLUCOSE 669*  ?BUN 16  ?CREATININE 0.95  ?CALCIUM 8.2*  ?GFRNONAA >60  ?ANIONGAP 17*   ?  ?Recent Labs  ?Lab 31-Oct-2021 ?1013  ?PROT 6.3*  ?ALBUMIN 2.8*  ?AST 243*  ?ALT 172*  ?ALKPHOS 113  ?BILITOT 0.4  ? ?Lipids No results for input(s): CHOL, TRIG, HDL, LABVLDL, LDLCALC, CHOLHDL in the last 168 hours.  ?Hematology ?Recent Labs  ?Lab 31-Oct-2021 ?1013  ?WBC 28.2*  ?RBC 4.68  ?HGB 12.3  ?HCT 41.6  ?MCV 88.9  ?MCH 26.3  ?MCHC 29.6*  ?RDW 13.3  ?PLT 356  ? ? ?Assessment and Plan:  ? ?Cardiac arrest: ?Given report of sudden onset of chest pain prior to cardiac arrest and EKG during ROSC with widespread ST elevation, I suspect that STEMI with large territory of involvement precipitated the patient's cardiac arrest.  Despite parotic  efforts by EMS and the ED team, we were unable to resuscitate Ms. Swoyer long enough to allow for safe transport to the cardiac Cath Lab.  When her mother arrived, she asked that resuscitative efforts be stopped in favor of comfort measures. ? ?CRITICAL CARE ?Performed by: Nelva Bush, MD ? ?Total critical care time: 60 minutes ? ?Critical care time was exclusive of separately billable procedures and treating other patients. ? ?Critical care was necessary to treat or prevent imminent or life-threatening deterioration. ? ?Critical care was time spent personally by me (independent of midlevel providers or residents) on the following activities: development of treatment plan with patient and/or surrogate as well as nursing, discussions with consultants, evaluation of patient's response to treatment, examination of patient, obtaining history from patient or surrogate, ordering and performing treatments and interventions, ordering and review of laboratory studies, ordering and review of radiographic studies, pulse oximetry and re-evaluation of patient's condition. ? ?For questions or updates, please contact [place] ?Please consult [email or web address] for contact info under [facility]. ? ?Signed, ?Nelva Bush, MD  ?2021-11-20 6:03 PM ? ?

## 2021-11-18 NOTE — ED Notes (Signed)
Pt shocked at this time, no pulses present CPR started

## 2021-11-18 NOTE — ED Notes (Signed)
Cardiologist at bedside with [place], pt has femoral pulse per MD Quale ?

## 2021-11-18 NOTE — ED Notes (Signed)
Pulse check, no pulse present, lucas placed and cpr restarted ?

## 2021-11-18 NOTE — ED Provider Notes (Signed)
? ?[facility] ?Provider Note ? ? ? Event Date/Time  ? First MD Initiated Contact with Patient Nov 08, 2021 1000   ?  (approximate) ? ? ?History  ? ?CPR ? ? ?HPI ? ?EM caveat: Postarrest, unresponsive ? ?Savannah Leach is a 41 y.o. female history of hypothyroidism and per the patient's mother preterm ? ?Suddenly notified her mother she was having chest pain that radiated to her neck and back.  She was then witnessed to collapse.  EMS was called.  EMS advised patient had shockable rhythm and was defibrillated about 3 times.  Return of circulation delivered the hospital. ? ? ?  ? ? ?Physical Exam  ? ?Triage Vital Signs: ?ED Triage Vitals  ?Enc Vitals Group  ?   BP 08-Nov-2021 1013 (!) 136/120  ?   Pulse Rate 08-Nov-2021 1018 (!) 102  ?   Resp Nov 08, 2021 1007 (!) 31  ?   Temp 2021/11/08 1030 (!) 96.4 ?F (35.8 ?C)  ?   Temp Source 11/08/2021 1032 Rectal  ?   SpO2 11-08-2021 1018 (!) 87 %  ?   Weight --   ?   Height --   ?   Head Circumference --   ?   Peak Flow --   ?   Pain Score 11-08-2021 1005 0  ?   Pain Loc --   ?   Pain Edu? --   ?   Excl. in GC? --   ? ? ?Most recent vital signs: ?Vitals:  ? Nov 08, 2021 1128 11-08-21 1129  ?BP:    ?Pulse:    ?Resp:    ?Temp: 98.2 ?F (36.8 ?C) 98.2 ?F (36.8 ?C)  ?SpO2:    ? ? ? ?General: Unresponsive.  Bag-valve-mask ventilations with good end-tidal waveform and appropriate chest rise and fall.  Patient occasionally is triggering spontaneous breaths. ?CV:  Cool slightly cyanotic in her nailbeds.  Pulses present ?Resp:  Assisting with bag-valve-mask ?Abd:  No distention.  Patient is noted to be morbidly obese ?Other:   ? ? ?ED Results / Procedures / Treatments  ? ?Labs ?(all labs ordered are listed, but only abnormal results are displayed) ?Labs Reviewed  ?COMPREHENSIVE METABOLIC PANEL - Abnormal; Notable for the following components:  ?    Result Value  ? Sodium 131 (*)   ? Potassium 3.3 (*)   ? CO2 15 (*)   ? Glucose, Bld 669 (*)   ? Calcium 8.2 (*)   ? Total Protein 6.3 (*)   ?  Albumin 2.8 (*)   ? AST 243 (*)   ? ALT 172 (*)   ? Anion gap 17 (*)   ? All other components within normal limits  ?LACTIC ACID, PLASMA - Abnormal; Notable for the following components:  ? Lactic Acid, Venous >9.0 (*)   ? All other components within normal limits  ?CBC WITH DIFFERENTIAL/PLATELET - Abnormal; Notable for the following components:  ? WBC 28.2 (*)   ? MCHC 29.6 (*)   ? Neutro Abs 14.5 (*)   ? Lymphs Abs 9.4 (*)   ? Basophils Absolute 0.3 (*)   ? Abs Immature Granulocytes 2.90 (*)   ? All other components within normal limits  ?PROTIME-INR - Abnormal; Notable for the following components:  ? Prothrombin Time 18.6 (*)   ? INR 1.6 (*)   ? All other components within normal limits  ?URINALYSIS, ROUTINE W REFLEX MICROSCOPIC - Abnormal; Notable for the following components:  ? Color, Urine YELLOW (*)   ?  APPearance CLEAR (*)   ? Glucose, UA >=500 (*)   ? All other components within normal limits  ?CBG MONITORING, ED - Abnormal; Notable for the following components:  ? Glucose-Capillary 489 (*)   ? All other components within normal limits  ?TROPONIN I (HIGH SENSITIVITY) - Abnormal; Notable for the following components:  ? Troponin I (High Sensitivity) 1,550 (*)   ? All other components within normal limits  ?CULTURE, BLOOD (ROUTINE X 2)  ?CULTURE, BLOOD (ROUTINE X 2)  ?LACTIC ACID, PLASMA  ?PATHOLOGIST SMEAR REVIEW  ?POC URINE PREG, ED  ? ? ? ?EKG ? ?Prehospital EKGs reviewed, and concerning for possible STEMI.  Dr. Okey DupreEnd from cardiology responded, also reviewed EKG ? ?ED twelve-lead at 1005 reviewed ?Heart rate 120 ?QRS 220 ?QTc 500 or greater somewhat hard to delineate ?The underlying rhythm is slightly unclear, but appears to be massive ST segment elevation in multiple leads.  Diffuse ischemic picture. ? ? ?RADIOLOGY ? ? ? ? ?PROCEDURES: ? ?Critical Care performed: Yes, see critical care procedure note(s) ? ?Procedure Name: Intubation ?Date/Time: July 14, 2022 11:39 AM ?Performed by: Sharyn CreamerQuale, Artie Takayama,  MD ?Pre-anesthesia Checklist: Patient identified, Patient being monitored, Emergency Drugs available and Suction available ?Oxygen Delivery Method: Ambu bag ?Preoxygenation: Pre-oxygenation with 100% oxygen ?Ventilation: Mask ventilation without difficulty ?Laryngoscope Size: Glidescope and 3 ?Grade View: Grade II ?Tube type: Subglottic suction tube ?Tube size: 8.0 mm ?Number of attempts: 1 ?Airway Equipment and Method: Video-laryngoscopy ?Placement Confirmation: ETT inserted through vocal cords under direct vision, CO2 detector, Breath sounds checked- equal and bilateral and Positive ETCO2 ?Secured at: 21 cm ?Tube secured with: ETT holder ?Dental Injury: Bloody posterior oropharynx  ?Comments: Blood in posterior. No injury or dental injury. Suspect from tracheal 2nd to prior CPR and ongoing CPR ? ? ? ? ?Cardiopulmonary Resuscitation (CPR) Procedure Note ?Directed/Performed by: Sharyn CreamerMark Wanya Bangura ?I personally directed ancillary staff and/or performed CPR in an effort to regain return of spontaneous circulation and to maintain cardiac, neuro and systemic perfusion.  ? ?CRITICAL CARE ?Performed by: Sharyn CreamerMark Barton Want ? ? ?Total critical care time: 60 minutes ? ?Critical care time was exclusive of separately billable procedures and treating other patients. ? ?Critical care was necessary to treat or prevent imminent or life-threatening deterioration. ? ?Critical care was time spent personally by me on the following activities: development of treatment plan with patient and/or surrogate as well as nursing, discussions with consultants, evaluation of patient's response to treatment, examination of patient, obtaining history from patient or surrogate, ordering and performing treatments and interventions, ordering and review of laboratory studies, ordering and review of radiographic studies, pulse oximetry and re-evaluation of patient's condition. ? ?CRITICAL CARE ?Performed by: Sharyn CreamerMark Rainy Rothman ? ? ?Total critical care time: 15  minutes ? ?Critical care time was exclusive of separately billable procedures and treating other patients. ? ?Critical care was necessary to treat or prevent imminent or life-threatening deterioration. ? ?Critical care was time spent personally by me on the following activities: development of treatment plan with patient and/or surrogate as well as nursing, discussions with consultants, evaluation of patient's response to treatment, examination of patient, obtaining history from patient or surrogate, ordering and performing treatments and interventions, ordering and review of laboratory studies, ordering and review of radiographic studies, pulse oximetry and re-evaluation of patient's condition. ? ?Patient had significant and major critical care requirements including my participation at bedside essentially from beginning to and of presentation.  Also required consultation with family ? ?MEDICATIONS ORDERED IN ED: ?Medications  ?rocuronium bromide 100  MG/10ML SOSY (has no administration in time range)  ?etomidate (AMIDATE) 2 MG/ML injection (has no administration in time range)  ?vecuronium (NORCURON) 10 MG injection (has no administration in time range)  ?fentaNYL (SUBLIMAZE) 50 MCG/ML injection (has no administration in time range)  ? ? ? ?IMPRESSION / MDM / ASSESSMENT AND PLAN / ED COURSE  ?I reviewed the triage vital signs and the nursing notes. ?             ?               ? ?Differential diagnosis includes, but is not limited to, acute cardiac arrest secondary to ACS, STEMI, also considerations would be dissection, coronary dissection, thoracic dissection, acute intracranial etiologies etc.  Based on the presentation severe chest pain rating to the neck and upper back possible dissection or ACS.  EKG appears consistent with STEMI. ? ?Thrombolytics were considered, but after discussion with cardiology decision was made against this given the patient's age felt coronary dissection or other dissection is also  high on differential. ? ?The patient underwent multiple rounds of CPR, occasional return of pulses slow with wide morphology.  Occasionally requiring pacing, and off-and-on had multiple episodes of rearrest while in the emergency dep

## 2021-11-18 NOTE — ED Notes (Signed)
Pt being tubed at this time.  ? ?

## 2021-11-18 NOTE — ED Notes (Signed)
Pacing to be started attempting to capture at this time ?

## 2021-11-18 NOTE — ED Notes (Signed)
Dopamine started at this time

## 2021-11-18 DEATH — deceased
